# Patient Record
Sex: Male | Born: 1975 | Race: White | Hispanic: No | Marital: Single | Smoking: Never smoker
Health system: Southern US, Community
[De-identification: ages and names within clinical notes are randomized; demographics above are authoritative.]

## PROBLEM LIST (undated history)

## (undated) DIAGNOSIS — T8859XA Other complications of anesthesia, initial encounter: Secondary | ICD-10-CM

## (undated) DIAGNOSIS — M47816 Spondylosis without myelopathy or radiculopathy, lumbar region: Secondary | ICD-10-CM

## (undated) DIAGNOSIS — K209 Esophagitis, unspecified without bleeding: Secondary | ICD-10-CM

## (undated) DIAGNOSIS — K219 Gastro-esophageal reflux disease without esophagitis: Secondary | ICD-10-CM

## (undated) DIAGNOSIS — K2100 Gastro-esophageal reflux disease with esophagitis, without bleeding: Secondary | ICD-10-CM

## (undated) HISTORY — PX: TONSILLECTOMY: SUR1361

## (undated) HISTORY — DX: Gastro-esophageal reflux disease without esophagitis: K21.9

## (undated) HISTORY — PX: ADENOIDECTOMY: SUR15

---

## 2010-11-14 DIAGNOSIS — M545 Low back pain, unspecified: Secondary | ICD-10-CM | POA: Insufficient documentation

## 2013-07-13 DIAGNOSIS — F450 Somatization disorder: Secondary | ICD-10-CM | POA: Insufficient documentation

## 2014-01-25 DIAGNOSIS — M5126 Other intervertebral disc displacement, lumbar region: Secondary | ICD-10-CM | POA: Insufficient documentation

## 2014-12-23 ENCOUNTER — Emergency Department
Admission: EM | Admit: 2014-12-23 | Discharge: 2014-12-23 | Disposition: A | Discharge status: Home / Self Care | Payer: Self-pay | Attending: Emergency Medicine | Admitting: Emergency Medicine

## 2014-12-23 ENCOUNTER — Encounter: Payer: Self-pay | Admitting: Emergency Medicine

## 2014-12-23 DIAGNOSIS — T18128A Food in esophagus causing other injury, initial encounter: Secondary | ICD-10-CM | POA: Insufficient documentation

## 2014-12-23 DIAGNOSIS — X58XXXA Exposure to other specified factors, initial encounter: Secondary | ICD-10-CM | POA: Insufficient documentation

## 2014-12-23 DIAGNOSIS — Y998 Other external cause status: Secondary | ICD-10-CM | POA: Insufficient documentation

## 2014-12-23 DIAGNOSIS — T18108A Unspecified foreign body in esophagus causing other injury, initial encounter: Secondary | ICD-10-CM

## 2014-12-23 DIAGNOSIS — Y9389 Activity, other specified: Secondary | ICD-10-CM | POA: Insufficient documentation

## 2014-12-23 DIAGNOSIS — Y9289 Other specified places as the place of occurrence of the external cause: Secondary | ICD-10-CM | POA: Insufficient documentation

## 2014-12-23 HISTORY — DX: Gastro-esophageal reflux disease with esophagitis: K21.0

## 2014-12-23 HISTORY — DX: Gastro-esophageal reflux disease with esophagitis, without bleeding: K21.00

## 2014-12-23 HISTORY — DX: Spondylosis without myelopathy or radiculopathy, lumbar region: M47.816

## 2014-12-23 MED ORDER — GLUCAGON HCL RDNA (DIAGNOSTIC) 1 MG IJ SOLR
INTRAMUSCULAR | Status: AC
  Filled 2014-12-23: qty 1

## 2014-12-23 MED ORDER — GLUCAGON HCL RDNA (DIAGNOSTIC) 1 MG IJ SOLR
1.0000 mg | Freq: Once | INTRAMUSCULAR | Status: AC | PRN
  Administered 2014-12-23: 1 mg via INTRAMUSCULAR

## 2014-12-23 MED ORDER — GLUCAGON HCL RDNA (DIAGNOSTIC) 1 MG IJ SOLR: 1.0000 mg | Freq: Once | INTRAMUSCULAR | Status: DC | PRN

## 2014-12-23 MED ORDER — GLUCAGON HCL (RDNA) 1 MG IJ SOLR
1.0000 mg | Freq: Once | INTRAMUSCULAR | Status: DC
  Filled 2014-12-23: qty 1

## 2014-12-23 NOTE — ED Notes (Signed)
Attempting to swallow cold water. Pt refuses to cola reccommended by MD states too painful

## 2014-12-23 NOTE — Discharge Instructions (Signed)
Swallowed Foreign Body, Adult °You have swallowed an object (foreign body). Once the foreign body has passed through the food tube (esophagus), which leads from the mouth to the stomach, it will usually continue through the body without problems. This is because the point where the esophagus enters into the stomach is the narrowest place through which the foreign body must pass. °Sometimes the foreign body gets stuck. The most common type of foreign body obstruction in adults is food impaction. Many times, bones from fish or meat products may become lodged in the esophagus or injure the throat on the way down. When there is an object that obstructs the esophagus, the most obvious symptoms are pain and the inability to swallow normally. In some cases, foreign bodies that can be life threatening are swallowed. Examples of these are certain medications and illicit drugs. Often in these instances, patients are afraid of telling what they swallowed. However, it is extremely important to tell the emergency caregiver what was swallowed because life-saving treatment may be needed.  °X-ray exams may be taken to find the location of the foreign body. However, some objects do not show up well or may be too small to be seen on an X-ray image. If the foreign body is too large or too sharp, it may be too dangerous to allow it to pass on its own. You may need to see a caregiver who specializes in the digestive system (gastroenterologist). In a few cases, a specialist may need to remove the object using a method called "endoscopy".  This involves passing a thin, soft, flexible tube into the food pipe to locate and remove the object. Follow up with your primary doctor or the referral you were given by the emergency caregiver. °HOME CARE INSTRUCTIONS  °· If your caregiver says it is safe for you to eat, then only have liquids and soft foods until your symptoms improve. °· Once you are eating normally: °¨ Cut food into small  pieces. °¨ Remove small bones from food. °¨ Remove large seeds and pits from fruit. °¨ Chew your food well. °¨ Do not talk, laugh, or engage in physical activity while eating or swallowing. °SEEK MEDICAL CARE IF: °· You develop worsening shortness of breath, uncontrollable coughing, chest pains or high fever, greater than 102° F (38.9° C). °· You are unable to eat or drink or you feel that food is getting stuck in your throat. °· You have choking symptoms or cannot stop drooling. °· You develop abdominal pain, vomiting (especially of blood), or rectal bleeding. °MAKE SURE YOU:  °· Understand these instructions. °· Will watch your condition. °· Will get help right away if you are not doing well or get worse. °Document Released: 01/07/2010 Document Revised: 10/12/2011 Document Reviewed: 01/07/2010 °[employer]® Patient Information ©2015 [employer]. This information is not intended to replace advice given to you by your health care provider. Make sure you discuss any questions you have with your health care provider. ° °

## 2014-12-23 NOTE — ED Notes (Signed)
Pt presents to ED c/o foreign body stuck in esophagus.  He was eating apple chips an hour ago and some are stuck in his throat.  He states that in the past few minutes he began feeling short of breath as well.  He has chronic esophagitis and has had this problem in the past.  He is actively gagging and attempting to vomit during triage.

## 2014-12-23 NOTE — ED Provider Notes (Signed)
[facility] Emergency Department Provider Note  ____________________________________________  Time seen: 12:10 PM  I have reviewed the triage vital signs and the nursing notes.   HISTORY  Chief Complaint Foreign Body      HPI Vincent Huffman is a 39 y.o. male presents with a Apple food bolus stuck in throat times one hour patient admits to brief episode of dyspnea now resolved. Patient admits to chronic esophagitis and multiple episodes of food boluses being stuck in the past.     Past Medical History  Diagnosis Date  . Chronic reflux esophagitis   . Degenerative joint disease (DJD) of lumbar spine     There are no active problems to display for this patient.   Past Surgical History  Procedure Laterality Date  . Tonsillectomy    . Adenoidectomy      No current outpatient prescriptions on file.  Allergies Review of patient's allergies indicates no known allergies.  No family history on file.  Social History History  Substance Use Topics  . Smoking status: Not on file  . Smokeless tobacco: Not on file  . Alcohol Use: Not on file    Review of Systems  Constitutional: Negative for fever. Eyes: Negative for visual changes. ENT: Negative for sore throat. Positive Food stuck in throat Cardiovascular: Negative for chest pain. Respiratory: Negative for shortness of breath. Gastrointestinal: Negative for abdominal pain, vomiting and diarrhea. Genitourinary: Negative for dysuria. Musculoskeletal: Negative for back pain. Skin: Negative for rash. Neurological: Negative for headaches, focal weakness or numbness.   10-point ROS otherwise negative.  ____________________________________________   PHYSICAL EXAM:  VITAL SIGNS: ED Triage Vitals  Enc Vitals Group     BP 12/23/14 1158 131/73 mmHg     Pulse Rate 12/23/14 1158 61     Resp 12/23/14 1158 20     Temp 12/23/14 1158 98.7 F (37.1 C)     Temp Source 12/23/14 1158 Oral   SpO2 12/23/14 1158 100 %     Weight 12/23/14 1158 170 lb (77.111 kg)     Height 12/23/14 1158  (1.803 m)     Head Cir --      Peak Flow --      Pain Score 12/23/14 1200 7     Pain Loc --      Pain Edu? --      Excl. in GC? --      Constitutional: Alert and oriented. Apparent distress, patient's spitting clear sputum Eyes: Conjunctivae are normal. PERRL. Normal extraocular movements. ENT   Head: Normocephalic and atraumatic.   Nose: No congestion/rhinnorhea.   Mouth/Throat: Mucous membranes are moist.   Neck: No stridor. Hematological/Lymphatic/Immunilogical: No cervical lymphadenopathy. Cardiovascular: Normal rate, regular rhythm. Normal and symmetric distal pulses are present in all extremities. No murmurs, rubs, or gallops. Respiratory: Normal respiratory effort without tachypnea nor retractions. Breath sounds are clear and equal bilaterally. No wheezes/rales/rhonchi. Gastrointestinal: Soft and nontender. No distention. There is no CVA tenderness. Genitourinary: deferred Musculoskeletal: Nontender with normal range of motion in all extremities. No joint effusions.  No lower extremity tenderness nor edema. Neurologic:  Normal speech and language. No gross focal neurologic deficits are appreciated. Speech is normal.  Skin:  Skin is warm, dry and intact. No rash noted. Psychiatric: Mood and affect are normal. Speech and behavior are normal. Patient exhibits appropriate insight and judgment.  ____________________________________________          INITIAL IMPRESSION / ASSESSMENT AND PLAN / ED COURSE  Pertinent labs & imaging  results that were available during my care of the patient were reviewed by me and considered in my medical decision making (see chart for details).  History of physical exam consistent with esophageal food bolus as such glucagon 1 mg IM given. Patient reevaluated at 12:45 PM stated that symptoms are completely resolved. She was given water  to drink and tolerated with no difficulty. We'll discharge patient home with outpatient follow up with gastrologist.  ____________________________________________   FINAL CLINICAL IMPRESSION(S) / ED DIAGNOSES  Final diagnoses:  Esophageal foreign body, initial encounter      Darci Currentandolph N Brown, MD 12/23/14 1302

## 2015-07-08 ENCOUNTER — Emergency Department
Admission: EM | Admit: 2015-07-08 | Discharge: 2015-07-08 | Disposition: A | Discharge status: Home / Self Care | Payer: No Typology Code available for payment source | Attending: Emergency Medicine | Admitting: Emergency Medicine

## 2015-07-08 ENCOUNTER — Emergency Department: Payer: No Typology Code available for payment source

## 2015-07-08 ENCOUNTER — Encounter: Payer: Self-pay | Admitting: Emergency Medicine

## 2015-07-08 DIAGNOSIS — Y92410 Unspecified street and highway as the place of occurrence of the external cause: Secondary | ICD-10-CM | POA: Insufficient documentation

## 2015-07-08 DIAGNOSIS — S51001A Unspecified open wound of right elbow, initial encounter: Secondary | ICD-10-CM | POA: Insufficient documentation

## 2015-07-08 DIAGNOSIS — S060X1A Concussion with loss of consciousness of 30 minutes or less, initial encounter: Secondary | ICD-10-CM | POA: Diagnosis not present

## 2015-07-08 DIAGNOSIS — S01311A Laceration without foreign body of right ear, initial encounter: Secondary | ICD-10-CM | POA: Diagnosis not present

## 2015-07-08 DIAGNOSIS — R1013 Epigastric pain: Secondary | ICD-10-CM

## 2015-07-08 DIAGNOSIS — Y9389 Activity, other specified: Secondary | ICD-10-CM | POA: Diagnosis not present

## 2015-07-08 DIAGNOSIS — S0990XA Unspecified injury of head, initial encounter: Secondary | ICD-10-CM | POA: Insufficient documentation

## 2015-07-08 DIAGNOSIS — S3991XA Unspecified injury of abdomen, initial encounter: Secondary | ICD-10-CM | POA: Diagnosis not present

## 2015-07-08 DIAGNOSIS — Z23 Encounter for immunization: Secondary | ICD-10-CM | POA: Insufficient documentation

## 2015-07-08 DIAGNOSIS — Y998 Other external cause status: Secondary | ICD-10-CM | POA: Insufficient documentation

## 2015-07-08 DIAGNOSIS — S0991XA Unspecified injury of ear, initial encounter: Secondary | ICD-10-CM | POA: Diagnosis present

## 2015-07-08 HISTORY — DX: Esophagitis, unspecified without bleeding: K20.90

## 2015-07-08 HISTORY — DX: Esophagitis, unspecified: K20.9
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
## 2015-07-08 MED ORDER — OXYCODONE-ACETAMINOPHEN 5-325 MG PO TABS
1.0000 | ORAL_TABLET | Freq: Once | ORAL | Status: AC
  Administered 2015-07-08: 1 via ORAL
  Filled 2015-07-08: qty 1

## 2015-07-08 MED ORDER — BACITRACIN ZINC 500 UNIT/GM EX OINT
TOPICAL_OINTMENT | CUTANEOUS | Status: AC
  Filled 2015-07-08: qty 1.8

## 2015-07-08 MED ORDER — BACITRACIN ZINC 500 UNIT/GM EX OINT
TOPICAL_OINTMENT | Freq: Two times a day (BID) | CUTANEOUS | Status: AC
  Administered 2015-07-08: 20:00:00 via TOPICAL

## 2015-07-08 MED ORDER — IBUPROFEN 800 MG PO TABS
800.0000 mg | ORAL_TABLET | Freq: Once | ORAL | Status: DC
  Filled 2015-07-08: qty 1

## 2015-07-08 MED ORDER — OXYCODONE-ACETAMINOPHEN 5-325 MG PO TABS: 1.0000 | ORAL_TABLET | Freq: Four times a day (QID) | ORAL | Status: DC | PRN

## 2015-07-08 MED ORDER — RANITIDINE HCL 150 MG PO CAPS: 150.0000 mg | ORAL_CAPSULE | Freq: Two times a day (BID) | ORAL | Status: DC

## 2015-07-08 MED ORDER — SUCRALFATE 1 G PO TABS: 1.0000 g | ORAL_TABLET | Freq: Four times a day (QID) | ORAL | Status: DC

## 2015-07-08 MED ORDER — TETANUS-DIPHTHERIA TOXOIDS TD 5-2 LFU IM INJ
0.5000 mL | INJECTION | Freq: Once | INTRAMUSCULAR | Status: AC
  Administered 2015-07-08: 0.5 mL via INTRAMUSCULAR
  Filled 2015-07-08: qty 0.5

## 2015-07-08 MED ORDER — RABEPRAZOLE SODIUM 20 MG PO TBEC: 20.0000 mg | DELAYED_RELEASE_TABLET | Freq: Every day | ORAL | Status: DC

## 2015-07-08 NOTE — ED Notes (Signed)
AAOx3.  Skin warm and dry. Ambulates with easy and steady gait. DC home 

## 2015-07-08 NOTE — Discharge Instructions (Signed)
You were prescribed a medication that is potentially sedating. Do not drink alcohol, drive or participate in any other potentially dangerous activities while taking this medication as it may make you sleepy. Do not take this medication with any other sedating medications, either prescription or over-the-counter. If you were prescribed Percocet or Vicodin, do not take these with acetaminophen (Tylenol) as it is already contained within these medications.   Opioid pain medications (or "narcotics") can be habit forming.  Use it as little as possible to achieve adequate pain control.  Do not use or use it with extreme caution if you have a history of opiate abuse or dependence.  If you are on a pain contract with your primary care doctor or a pain specialist, be sure to let them know you were prescribed this medication today from the [facility].  This medication is intended for your use only - do not give any to anyone else and keep it in a secure place where nobody else, especially children and pets, have access to it.  It will also cause or worsen constipation, so you may want to consider taking an over-the-counter stool softener while you are taking this medication.  Abdominal Pain, Adult Many things can cause abdominal pain. Usually, abdominal pain is not caused by a disease and will improve without treatment. It can often be observed and treated at home. Your health care provider will do a physical exam and possibly order blood tests and X-rays to help determine the seriousness of your pain. However, in many cases, more time must pass before a clear cause of the pain can be found. Before that point, your health care provider may not know if you need more testing or further treatment. HOME CARE INSTRUCTIONS Monitor your abdominal pain for any changes. The following actions may help to alleviate any discomfort you are experiencing:  Only take over-the-counter or prescription  medicines as directed by your health care provider.  Do not take laxatives unless directed to do so by your health care provider.  Try a clear liquid diet (broth, tea, or water) as directed by your health care provider. Slowly move to a bland diet as tolerated. SEEK MEDICAL CARE IF:  You have unexplained abdominal pain.  You have abdominal pain associated with nausea or diarrhea.  You have pain when you urinate or have a bowel movement.  You experience abdominal pain that wakes you in the night.  You have abdominal pain that is worsened or improved by eating food.  You have abdominal pain that is worsened with eating fatty foods.  You have a fever. SEEK IMMEDIATE MEDICAL CARE IF:  Your pain does not go away within 2 hours.  You keep throwing up (vomiting).  Your pain is felt only in portions of the abdomen, such as the right side or the left lower portion of the abdomen.  You pass bloody or black tarry stools. MAKE SURE YOU:  Understand these instructions.  Will watch your condition.  Will get help right away if you are not doing well or get worse.   This information is not intended to replace advice given to you by your health care provider. Make sure you discuss any questions you have with your health care provider.   Document Released: 04/29/2005 Document Revised: 04/10/2015 Document Reviewed: 03/29/2013 Elsevier Interactive Patient Education 2016 [employer].  Concussion, Adult A concussion, or closed-head injury, is a brain injury caused by a direct blow to the head or by  a quick and sudden movement (jolt) of the head or neck. Concussions are usually not life-threatening. Even so, the effects of a concussion can be serious. If you have had a concussion before, you are more likely to experience concussion-like symptoms after a direct blow to the head.  CAUSES  Direct blow to the head, such as from running into another player during a soccer game, being hit in a  fight, or hitting your head on a hard surface.  A jolt of the head or neck that causes the brain to move back and forth inside the skull, such as in a car crash. SIGNS AND SYMPTOMS The signs of a concussion can be hard to notice. Early on, they may be missed by you, family members, and health care providers. You may look fine but act or feel differently. Symptoms are usually temporary, but they may last for days, weeks, or even longer. Some symptoms may appear right away while others may not show up for hours or days. Every head injury is different. Symptoms include:  Mild to moderate headaches that will not go away.  A feeling of pressure inside your head.  Having more trouble than usual:  Learning or remembering things you have heard.  Answering questions.  Paying attention or concentrating.  Organizing daily tasks.  Making decisions and solving problems.  Slowness in thinking, acting or reacting, speaking, or reading.  Getting lost or being easily confused.  Feeling tired all the time or lacking energy (fatigued).  Feeling drowsy.  Sleep disturbances.  Sleeping more than usual.  Sleeping less than usual.  Trouble falling asleep.  Trouble sleeping (insomnia).  Loss of balance or feeling lightheaded or dizzy.  Nausea or vomiting.  Numbness or tingling.  Increased sensitivity to:  Sounds.  Lights.  Distractions.  Vision problems or eyes that tire easily.  Diminished sense of taste or smell.  Ringing in the ears.  Mood changes such as feeling sad or anxious.  Becoming easily irritated or angry for little or no reason.  Lack of motivation.  Seeing or hearing things other people do not see or hear (hallucinations). DIAGNOSIS Your health care provider can usually diagnose a concussion based on a description of your injury and symptoms. He or she will ask whether you passed out (lost consciousness) and whether you are having trouble remembering events  that happened right before and during your injury. Your evaluation might include:  A brain scan to look for signs of injury to the brain. Even if the test shows no injury, you may still have a concussion.  Blood tests to be sure other problems are not present. TREATMENT  Concussions are usually treated in an emergency department, in urgent care, or at a clinic. You may need to stay in the hospital overnight for further treatment.  Tell your health care provider if you are taking any medicines, including prescription medicines, over-the-counter medicines, and natural remedies. Some medicines, such as blood thinners (anticoagulants) and aspirin, may increase the chance of complications. Also tell your health care provider whether you have had alcohol or are taking illegal drugs. This information may affect treatment.  Your health care provider will send you home with important instructions to follow.  How fast you will recover from a concussion depends on many factors. These factors include how severe your concussion is, what part of your brain was injured, your age, and how healthy you were before the concussion.  Most people with mild injuries recover fully. Recovery can  take time. In general, recovery is slower in older persons. Also, persons who have had a concussion in the past or have other medical problems may find that it takes longer to recover from their current injury. HOME CARE INSTRUCTIONS General Instructions  Carefully follow the directions your health care provider gave you.  Only take over-the-counter or prescription medicines for pain, discomfort, or fever as directed by your health care provider.  Take only those medicines that your health care provider has approved.  Do not drink alcohol until your health care provider says you are well enough to do so. Alcohol and certain other drugs may slow your recovery and can put you at risk of further injury.  If it is harder than  usual to remember things, write them down.  If you are easily distracted, try to do one thing at a time. For example, do not try to watch TV while fixing dinner.  Talk with family members or close friends when making important decisions.  Keep all follow-up appointments. Repeated evaluation of your symptoms is recommended for your recovery.  Watch your symptoms and tell others to do the same. Complications sometimes occur after a concussion. Older adults with a brain injury may have a higher risk of serious complications, such as a blood clot on the brain.  Tell your teachers, school nurse, school counselor, coach, athletic trainer, or work Production designer, theatre/television/film about your injury, symptoms, and restrictions. Tell them about what you can or cannot do. They should watch for:  Increased problems with attention or concentration.  Increased difficulty remembering or learning new information.  Increased time needed to complete tasks or assignments.  Increased irritability or decreased ability to cope with stress.  Increased symptoms.  Rest. Rest helps the brain to heal. Make sure you:  Get plenty of sleep at night. Avoid staying up late at night.  Keep the same bedtime hours on weekends and weekdays.  Rest during the day. Take daytime naps or rest breaks when you feel tired.  Limit activities that require a lot of thought or concentration. These include:  Doing homework or job-related work.  Watching TV.  Working on the computer.  Avoid any situation where there is potential for another head injury (football, hockey, soccer, basketball, martial arts, downhill snow sports and horseback riding). Your condition will get worse every time you experience a concussion. You should avoid these activities until you are evaluated by the appropriate follow-up health care providers. Returning To Your Regular Activities You will need to return to your normal activities slowly, not all at once. You must give  your body and brain enough time for recovery.  Do not return to sports or other athletic activities until your health care provider tells you it is safe to do so.  Ask your health care provider when you can drive, ride a bicycle, or operate heavy machinery. Your ability to react may be slower after a brain injury. Never do these activities if you are dizzy.  Ask your health care provider about when you can return to work or school. Preventing Another Concussion It is very important to avoid another brain injury, especially before you have recovered. In rare cases, another injury can lead to permanent brain damage, brain swelling, or death. The risk of this is greatest during the first 7-10 days after a head injury. Avoid injuries by:  Wearing a seat belt when riding in a car.  Drinking alcohol only in moderation.  Wearing a helmet when biking, skiing,  skateboarding, skating, or doing similar activities.  Avoiding activities that could lead to a second concussion, such as contact or recreational sports, until your health care provider says it is okay.  Taking safety measures in your home.  Remove clutter and tripping hazards from floors and stairways.  Use grab bars in bathrooms and handrails by stairs.  Place non-slip mats on floors and in bathtubs.  Improve lighting in dim areas. SEEK MEDICAL CARE IF:  You have increased problems paying attention or concentrating.  You have increased difficulty remembering or learning new information.  You need more time to complete tasks or assignments than before.  You have increased irritability or decreased ability to cope with stress.  You have more symptoms than before. Seek medical care if you have any of the following symptoms for more than 2 weeks after your injury:  Lasting (chronic) headaches.  Dizziness or balance problems.  Nausea.  Vision problems.  Increased sensitivity to noise or light.  Depression or mood  swings.  Anxiety or irritability.  Memory problems.  Difficulty concentrating or paying attention.  Sleep problems.  Feeling tired all the time. SEEK IMMEDIATE MEDICAL CARE IF:  You have severe or worsening headaches. These may be a sign of a blood clot in the brain.  You have weakness (even if only in one hand, leg, or part of the face).  You have numbness.  You have decreased coordination.  You vomit repeatedly.  You have increased sleepiness.  One pupil is larger than the other.  You have convulsions.  You have slurred speech.  You have increased confusion. This may be a sign of a blood clot in the brain.  You have increased restlessness, agitation, or irritability.  You are unable to recognize people or places.  You have neck pain.  It is difficult to wake you up.  You have unusual behavior changes.  You lose consciousness. MAKE SURE YOU:  Understand these instructions.  Will watch your condition.  Will get help right away if you are not doing well or get worse.   This information is not intended to replace advice given to you by your health care provider. Make sure you discuss any questions you have with your health care provider.   Document Released: 10/10/2003 Document Revised: 08/10/2014 Document Reviewed: 02/09/2013 Elsevier Interactive Patient Education 2016 [employer].  Stitches, [place], or Adhesive Wound Closure Health care providers use stitches (sutures), staples, and certain glue (skin adhesives) to hold skin together while it heals (wound closure). You may need this treatment after you have surgery or if you cut your skin accidentally. These methods help your skin to heal more quickly and make it less likely that you will have a scar. A wound may take several months to heal completely. The type of wound you have determines when your wound gets closed. In most cases, the wound is closed as soon as possible (primary skin closure).  Sometimes, closure is delayed so the wound can be cleaned and allowed to heal naturally. This reduces the chance of infection. Delayed closure may be needed if your wound:  Is caused by a bite.  Happened more than 6 hours ago.  Involves loss of skin or the tissues under the skin.  Has dirt or debris in it that cannot be removed.  Is infected. WHAT ARE THE DIFFERENT KINDS OF WOUND CLOSURES? There are many options for wound closure. The one that your health care provider uses depends on how deep and how large  your wound is. Adhesive Glue To use this type of glue to close a wound, your health care provider holds the edges of the wound together and paints the glue on the surface of your skin. You may need more than one layer of glue. Then the wound may be covered with a light bandage (dressing). This type of skin closure may be used for small wounds that are not deep (superficial). Using glue for wound closure is less painful than other methods. It does not require a medicine that numbs the area (local anesthetic). This method also leaves nothing to be removed. Adhesive glue is often used for children and on facial wounds. Adhesive glue cannot be used for wounds that are deep, uneven, or bleeding. It is not used inside of a wound.  Adhesive Strips These strips are made of sticky (adhesive), porous paper. They are applied across your skin edges like a regular adhesive bandage. You leave them on until they fall off. Adhesive strips may be used to close very superficial wounds. They may also be used along with sutures to improve the closure of your skin edges.  Sutures Sutures are the oldest method of wound closure. Sutures can be made from natural substances, such as silk, or from synthetic materials, such as nylon and steel. They can be made from a material that your body can break down as your wound heals (absorbable), or they can be made from a material that needs to be removed from your skin  (nonabsorbable). They come in many different strengths and sizes. Your health care provider attaches the sutures to a steel needle on one end. Sutures can be passed through your skin, or through the tissues beneath your skin. Then they are tied and cut. Your skin edges may be closed in one continuous stitch or in separate stitches. Sutures are strong and can be used for all kinds of wounds. Absorbable sutures may be used to close tissues under the skin. The disadvantage of sutures is that they may cause skin reactions that lead to infection. Nonabsorbable sutures need to be removed. Staples When surgical staples are used to close a wound, the edges of your skin on both sides of the wound are brought close together. A staple is placed across the wound, and an instrument secures the edges together. Staples are often used to close surgical cuts (incisions). Staples are faster to use than sutures, and they cause less skin reaction. Staples need to be removed using a tool that bends the staples away from your skin. HOW DO I CARE FOR MY WOUND CLOSURE?  Take medicines only as directed by your health care provider.  If you were prescribed an antibiotic medicine for your wound, finish it all even if you start to feel better.  Use ointments or creams only as directed by your health care provider.  Wash your hands with soap and water before and after touching your wound.  Do not soak your wound in water. Do not take baths, swim, or use a hot tub until your health care provider approves.  Ask your health care provider when you can start showering. Cover your wound if directed by your health care provider.  Do not take out your own sutures or staples.  Do not pick at your wound. Picking can cause an infection.  Keep all follow-up visits as directed by your health care provider. This is important. HOW LONG WILL I HAVE MY WOUND CLOSURE?  Leave adhesive glue on your skin until  the glue peels  away.  Leave adhesive strips on your skin until the strips fall off.  Absorbable sutures will dissolve within several days.  Nonabsorbable sutures and staples must be removed. The location of the wound will determine how long they stay in. This can range from several days to a couple of weeks. WHEN SHOULD I SEEK HELP FOR MY WOUND CLOSURE? Contact your health care provider if:  You have a fever.  You have chills.  You have drainage, redness, swelling, or pain at your wound.  There is a bad smell coming from your wound.  The skin edges of your wound start to separate after your sutures have been removed.  Your wound becomes thick, raised, and darker in color after your sutures come out (scarring).   This information is not intended to replace advice given to you by your health care provider. Make sure you discuss any questions you have with your health care provider.   Document Released: 04/14/2001 Document Revised: 08/10/2014 Document Reviewed: 12/27/2013 Elsevier Interactive Patient Education [employer]
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
## 2017-03-15 ENCOUNTER — Other Ambulatory Visit: Payer: Self-pay

## 2017-03-15 ENCOUNTER — Encounter: Payer: Self-pay | Admitting: Emergency Medicine

## 2017-03-15 ENCOUNTER — Emergency Department
Admission: EM | Admit: 2017-03-15 | Discharge: 2017-03-15 | Disposition: A | Discharge status: Home / Self Care | Payer: Self-pay | Attending: Emergency Medicine | Admitting: Emergency Medicine

## 2017-03-15 DIAGNOSIS — Z79899 Other long term (current) drug therapy: Secondary | ICD-10-CM | POA: Insufficient documentation

## 2017-03-15 DIAGNOSIS — F419 Anxiety disorder, unspecified: Secondary | ICD-10-CM

## 2017-03-15 DIAGNOSIS — T50901A Poisoning by unspecified drugs, medicaments and biological substances, accidental (unintentional), initial encounter: Secondary | ICD-10-CM | POA: Insufficient documentation

## 2017-03-15 MED ORDER — HYDROXYZINE HCL 25 MG PO TABS
50.0000 mg | ORAL_TABLET | Freq: Once | ORAL | Status: AC
  Administered 2017-03-15: 50 mg via ORAL
  Filled 2017-03-15: qty 2

## 2017-03-15 NOTE — ED Triage Notes (Signed)
Pt arrives POV with c/o accicental overdose. Pt has multiple herbs and compounds that he uses and tonight he took 700 mg of his L-Tatrahydropalmatine instead of 50 mg around 1700. Pt is jittery in triage and poison control has been notified. Pt reports that he took this around 1700 in combination with 10-15 other herbal substances. Pt is in NAD at this time.

## 2017-03-15 NOTE — Discharge Instructions (Signed)
Return to the ER for worsening symptoms, peristent vomiting, diffulty breathing or other concern

## 2017-03-15 NOTE — ED Notes (Signed)
Pt sleeping with no distress noted at this time.

## 2017-03-15 NOTE — ED Provider Notes (Signed)
[facility] Emergency Department Provider Note   ____________________________________________   First MD Initiated Contact with Patient 03/15/17 0118     (approximate)  I have reviewed the triage vital signs and the nursing notes.   HISTORY  Chief Complaint Ingestion    HPI Vincent Huffman is a 41 y.o. male who presents to the ED from home with a chief complaint of accidental overdose. Patient takes multiple over-the-counter herbal medications who states he accidentally took 700 mg of L-Tatrahydropalmatine instead of 50 mg at approximately 5 PM. Feels jittery and anxious. States he takes herbal medications for chronic back pain and inflammation.reports stressful week this week. Denies fever, chills, chest pain, shortness of breath, abdominal pain, nausea, vomiting, diarrhea. Denies SI/AH/VH. Denies recent travel or trauma.  Past Medical History:  Diagnosis Date  . Chronic reflux esophagitis   . Degenerative joint disease (DJD) of lumbar spine   . Esophagitis   Anxiety/panic attacks  There are no active problems to display for this patient.   Past Surgical History:  Procedure Laterality Date  . ADENOIDECTOMY    . TONSILLECTOMY      Prior to Admission medications   Medication Sig Start Date End Date Taking? Authorizing Provider  oxyCODONE-acetaminophen (ROXICET) 5-325 MG tablet Take 1 tablet by mouth every 6 (six) hours as needed for severe pain. 07/08/15   Sharman CheekStafford, Phillip, MD  RABEprazole (ACIPHEX) 20 MG tablet Take 1 tablet (20 mg total) by mouth daily. 07/08/15   Sharman CheekStafford, Phillip, MD  sucralfate (CARAFATE) 1 G tablet Take 1 tablet (1 g total) by mouth 4 (four) times daily. 07/08/15   Sharman CheekStafford, Phillip, MD    Allergies Patient has no known allergies.  No family history on file.  Social History Social History  Substance Use Topics  . Smoking status: Never Smoker  . Smokeless tobacco: Never Used  . Alcohol use No    Review of  Systems  Constitutional: No fever/chills. Eyes: No visual changes. ENT: No sore throat. Cardiovascular: Denies chest pain. Respiratory: Denies shortness of breath. Gastrointestinal: No abdominal pain.  No nausea, no vomiting.  No diarrhea.  No constipation. Genitourinary: Negative for dysuria. Musculoskeletal: Negative for back pain. Skin: Negative for rash. Neurological: Negative for headaches, focal weakness or numbness. Psychiatric:Positive for anxiety and jitteriness.  ____________________________________________   PHYSICAL EXAM:  VITAL SIGNS: ED Triage Vitals  Enc Vitals Group     BP 03/15/17 0029 109/72     Pulse Rate 03/15/17 0029 65     Resp 03/15/17 0029 18     Temp 03/15/17 0029 98.2 F (36.8 C)     Temp Source 03/15/17 0029 Oral     SpO2 03/15/17 0029 98 %     Weight 03/15/17 0030 185 lb (83.9 kg)     Height 03/15/17 0030 6' (1.829 m)     Head Circumference --      Peak Flow --      Pain Score 03/15/17 0029 0     Pain Loc --      Pain Edu? --      Excl. in GC? --     Constitutional: Alert and oriented. Well appearing and in no acute distress. Eyes: Conjunctivae are normal. PERRL. EOMI. Head: Atraumatic. Nose: No congestion/rhinnorhea. Mouth/Throat: Mucous membranes are moist.  Oropharynx non-erythematous. Neck: No stridor.   Cardiovascular: Normal rate, regular rhythm. Grossly normal heart sounds.  Good peripheral circulation. Respiratory: Normal respiratory effort.  No retractions. Lungs CTAB. Gastrointestinal: Soft and nontender. No distention. No  abdominal bruits. No CVA tenderness. Musculoskeletal: No lower extremity tenderness nor edema.  No joint effusions. Neurologic:  Normal speech and language. No gross focal neurologic deficits are appreciated. No gait instability. Skin:  Skin is warm, dry and intact. No rash noted. Psychiatric: Mood and affect are anxious. Speech and behavior are normal.  ____________________________________________    LABS (all labs ordered are listed, but only abnormal results are displayed)  Labs Reviewed - No data to display ____________________________________________  EKG  ED ECG REPORT I, SUNG,JADE J, the attending physician, personally viewed and interpreted this ECG.   Date: 03/15/2017  EKG Time: 0100  Rate: 53  Rhythm: sinus bradycardia  Axis: normal  Intervals:none  ST&T Change: nonspecific  ____________________________________________  RADIOLOGY  No results found.  ____________________________________________   PROCEDURES  Procedure(s) performed: None  Procedures  Critical Care performed: No  ____________________________________________   INITIAL IMPRESSION / ASSESSMENT AND PLAN / ED COURSE  Pertinent labs & imaging results that were available during my care of the patient were reviewed by me and considered in my medical decision making (see chart for details).  41 year old male who presents with accidental overdose of herbal medication Presents with anxiety and feeling jittery. Triage nurse discussed with poison control; L-Tatrahydropalmatine is actually an anxiolytic. The quantity patient ingested is nontoxic and half life is only 2 hours so it should be well out of his system by now. However, patient took this in combination with multiple herbs and compounds which may be stimulants.. EKG within normal limits. Patient feeling anxious but declines benzodiazepines as he has been on them before and had a hard time getting off. Will administer hydroxyzine Patient reluctant to go home at this time secondary to anxiety; will observe overnight and patient may be discharged in the morning.  Clinical Course as of Mar 16 543  Mon Mar 15, 2017  0543 Patient slept comfortably. Now awake, feels much better and eager for discharge. Strict return precautions given. Patient verbalizes understanding and agrees with plan of care.  [JS]    Clinical Course User Index [JS] Irean Hong, MD     ____________________________________________   FINAL CLINICAL IMPRESSION(S) / ED DIAGNOSES  Final diagnoses:  Accidental drug overdose, initial encounter  Anxiety      NEW MEDICATIONS STARTED DURING THIS VISIT:  New Prescriptions   No medications on file     Note:  This document was prepared using Dragon voice recognition software and may include unintentional dictation errors.    Irean Hong, MD 03/15/17 [phone]

## 2017-03-15 NOTE — ED Notes (Signed)
See triage note of pt ingestion. Pt denies any SI/HI, pt c/o mild panic attacks with lethargy since ingestion. Pt A&O, pt appears to be in no distress at this time, respirations even and unlabored. Per triage nurse Lurena Joinerebecca, RN has talked to poison control and informed to monitor pt only.

## 2017-03-15 NOTE — ED Notes (Signed)
Per poison control Patty, no labs are necessary for treatment. Compound was not ingested at a level (1500-1750 mg) Peak absorption is around 2 hours after ingestion per poison control. No observation needed or medical testing needed per poison control.

## 2017-12-09 ENCOUNTER — Ambulatory Visit: Payer: Self-pay

## 2017-12-16 ENCOUNTER — Ambulatory Visit: Payer: Self-pay | Admitting: Adult Health Nurse Practitioner

## 2017-12-16 VITALS — BP 123/69 | HR 76 | Temp 98.3°F | Wt 177.5 lb

## 2017-12-16 DIAGNOSIS — Z0000 Encounter for general adult medical examination without abnormal findings: Secondary | ICD-10-CM | POA: Insufficient documentation

## 2017-12-16 NOTE — Progress Notes (Signed)
   Subjective:    Patient ID: Vincent Huffman, male    DOB: 1975-12-31, 42 y.o.   MRN: [id number]  HPI  Vincent Huffman is a 42 yo M here to re-establish care with [place] . He presents with complaints of back pain and stiffness. Back pain: pt has seen specialist in [place] and has been referred to pain management. He takes natural remedies and Doans and manages it himself.  He does not take med for anything else.  He does not smoke or drink or use drugs.   Patient Active Problem List   Diagnosis Date Noted  . Healthcare maintenance 12/16/2017   Allergies as of 12/16/2017   No Known Allergies     Medication List        Accurate as of 12/16/17  7:44 PM. Always use your most recent med list.          oxyCODONE-acetaminophen 5-325 MG tablet Commonly known as:  ROXICET Take 1 tablet by mouth every 6 (six) hours as needed for severe pain.   RABEprazole 20 MG tablet Commonly known as:  ACIPHEX Take 1 tablet (20 mg total) by mouth daily.   sucralfate 1 g tablet Commonly known as:  CARAFATE Take 1 tablet (1 g total) by mouth 4 (four) times daily.        Review of Systems     Objective:   Physical Exam  Constitutional: He is oriented to person, place, and time. He appears well-developed and well-nourished.  Cardiovascular: Normal rate, regular rhythm and normal heart sounds.  Pulmonary/Chest: Effort normal and breath sounds normal.  Abdominal: Soft. Bowel sounds are normal.  Neurological: He is alert and oriented to person, place, and time.  Vitals reviewed.   BP 123/69   Pulse 76   Temp 98.3 F (36.8 C)   Wt 177 lb 8 oz (80.5 kg)   BMI 24.07 kg/m        Assessment & Plan:    Labs tonight.  Completed forms for substitute teaching.  Vision 20/15 bilateral.   F/u in 4 weeks for lab review.

## 2017-12-17 LAB — COMPREHENSIVE METABOLIC PANEL
A/G RATIO: 2.6 — AB (ref 1.2–2.2)
ALT: 17 IU/L (ref 0–44)
AST: 23 IU/L (ref 0–40)
Albumin: 4.7 g/dL (ref 3.5–5.5)
Alkaline Phosphatase: 47 IU/L (ref 39–117)
BUN/Creatinine Ratio: 12 (ref 9–20)
BUN: 12 mg/dL (ref 6–24)
Bilirubin Total: 0.6 mg/dL (ref 0.0–1.2)
CALCIUM: 9.3 mg/dL (ref 8.7–10.2)
CHLORIDE: 98 mmol/L (ref 96–106)
CO2: 25 mmol/L (ref 20–29)
Creatinine, Ser: 1.01 mg/dL (ref 0.76–1.27)
GFR calc Af Amer: 106 mL/min/{1.73_m2} (ref >59)
GFR calc non Af Amer: 92 mL/min/{1.73_m2} (ref >59)
GLUCOSE: 84 mg/dL (ref 65–99)
Globulin, Total: 1.8 g/dL (ref 1.5–4.5)
POTASSIUM: 4.5 mmol/L (ref 3.5–5.2)
Sodium: 140 mmol/L (ref 134–144)
Total Protein: 6.5 g/dL (ref 6.0–8.5)

## 2017-12-17 LAB — CBC
Hematocrit: 39.4 % (ref 37.5–51.0)
Hemoglobin: 13.1 g/dL (ref 13.0–17.7)
MCH: 30.3 pg (ref 26.6–33.0)
MCHC: 33.2 g/dL (ref 31.5–35.7)
MCV: 91 fL (ref 79–97)
PLATELETS: 228 10*3/uL (ref 150–379)
RBC: 4.32 x10E6/uL (ref 4.14–5.80)
RDW: 12.9 % (ref 12.3–15.4)
WBC: 7.1 10*3/uL (ref 3.4–10.8)

## 2017-12-17 LAB — LIPID PANEL
CHOL/HDL RATIO: 4.3 ratio (ref 0.0–5.0)
Cholesterol, Total: 155 mg/dL (ref 100–199)
HDL: 36 mg/dL — ABNORMAL LOW (ref >39)
LDL Calculated: 97 mg/dL (ref 0–99)
Triglycerides: 112 mg/dL (ref 0–149)
VLDL Cholesterol Cal: 22 mg/dL (ref 5–40)

## 2017-12-17 LAB — TSH: TSH: 1.91 u[IU]/mL (ref 0.450–4.500)

## 2017-12-17 LAB — HEMOGLOBIN A1C
Est. average glucose Bld gHb Est-mCnc: 91 mg/dL
Hgb A1c MFr Bld: 4.8 % (ref 4.8–5.6)

## 2018-01-11 ENCOUNTER — Ambulatory Visit: Payer: Self-pay | Admitting: Family Medicine

## 2018-01-11 VITALS — BP 118/71 | HR 86 | Temp 98.3°F | Ht 72.0 in | Wt 167.7 lb

## 2018-01-11 DIAGNOSIS — Z8739 Personal history of other diseases of the musculoskeletal system and connective tissue: Secondary | ICD-10-CM

## 2018-01-11 DIAGNOSIS — Z09 Encounter for follow-up examination after completed treatment for conditions other than malignant neoplasm: Secondary | ICD-10-CM

## 2018-01-11 NOTE — Progress Notes (Signed)
Subjective:    Patient ID: Vincent Huffman, male    DOB: 1975-12-01, 42 y.o.   MRN: [id number]   PCP: Raliegh Ip, NP  Chief Complaint  Patient presents with  . Follow-up    Follow-up for blood exams   HPI  Mr. Schappert has a history of DJD and Esophagitis. He is here for follow up.   Current Status: Since his last office visit, he is doing well with no complaints. He denies fevers, chills, fatigue, recent infections, weight loss, and night sweats. He has not had any headaches, visual changes, dizziness, and falls. No chest pain, heart palpitations, cough and shortness of breath reported. No reports of GI problems. He has no reports of blood in stools, dysuria and hematuria. No depression or anxiety.   He has DJD and reports mild pain today, which he takes Acetaminophen for relief.   Past Medical History:  Diagnosis Date  . Chronic reflux esophagitis   . Degenerative joint disease (DJD) of lumbar spine   . Esophagitis     No family history on file.  Social History   Socioeconomic History  . Marital status: Single    Spouse name: Not on file  . Number of children: Not on file  . Years of education: Not on file  . Highest education level: Not on file  Occupational History  . Occupation: Working Chief Executive Officer  . Financial resource strain: Hard  . Food insecurity:    Worry: Sometimes true    Inability: Sometimes true  . Transportation needs:    Medical: No    Non-medical: No  Tobacco Use  . Smoking status: Never Smoker  . Smokeless tobacco: Never Used  Substance and Sexual Activity  . Alcohol use: No  . Drug use: No  . Sexual activity: Not on file  Lifestyle  . Physical activity:    Days per week: 5 days    Minutes per session: 60 min  . Stress: Only a little  Relationships  . Social connections:    Talks on phone: Once a week    Gets together: Once a week    Attends religious service: More than 4 times per year    Active member of club or  organization: No    Attends meetings of clubs or organizations: Never    Relationship status: Never married  . Intimate partner violence:    Fear of current or ex partner: Patient refused    Emotionally abused: Patient refused    Physically abused: Patient refused    Forced sexual activity: Patient refused  Other Topics Concern  . Not on file  Social History Narrative  . Not on file    Past Surgical History:  Procedure Laterality Date  . ADENOIDECTOMY    . TONSILLECTOMY      Immunization History  Administered Date(s) Administered  . Td 07/08/2015    No outpatient medications have been marked as taking for the 01/11/18 encounter (Office Visit) with Kallie Locks, FNP.    No Known Allergies   BP 118/71   Pulse 86   Temp 98.3 F (36.8 C)   Ht 6' (1.829 m)   Wt 167 lb 11.2 oz (76.1 kg)   BMI 22.74 kg/m     Review of Systems  Constitutional: Negative.   HENT: Negative.   Eyes: Negative.   Respiratory: Negative.   Cardiovascular: Negative.   Gastrointestinal: Negative.   Endocrine: Negative.   Genitourinary: Negative.   Musculoskeletal: Positive for  back pain (Degenerative Disc Disease).  Skin: Negative.   Allergic/Immunologic: Negative.   Neurological: Negative.   Hematological: Negative.   Psychiatric/Behavioral: Negative.    Objective:   Physical Exam  Constitutional: He is oriented to person, place, and time. He appears well-developed and well-nourished.  HENT:  Head: Normocephalic and atraumatic.  Right Ear: External ear normal.  Left Ear: External ear normal.  Nose: Nose normal.  Mouth/Throat: Oropharynx is clear and moist.  Eyes: Pupils are equal, round, and reactive to light. Conjunctivae and EOM are normal.  Neck: Normal range of motion. Neck supple.  Cardiovascular: Normal rate, regular rhythm, normal heart sounds and intact distal pulses.  Pulmonary/Chest: Effort normal and breath sounds normal.  Abdominal: Soft. Bowel sounds are normal.   Musculoskeletal:  Limited ROM in back  Neurological: He is alert and oriented to person, place, and time.  Skin: Skin is warm and dry. Capillary refill takes less than 2 seconds.  Psychiatric: He has a normal mood and affect. His behavior is normal. Judgment and thought content normal.  Nursing note and vitals reviewed.  Assessment & Plan:   1. Hx of degenerative disc disease He has been referred to Pain Management for DJD. He pain is stable today. He will continue to use Acetaminophen for pain.   2. Follow up We will follow up with him in 6 months.   No orders of the defined types were placed in this encounter.   Raliegh IpNatalie [facility] [place],  MSN, FNP-BC [facility] [place] [address] [place], [place] [address] [phone]

## 2018-07-05 ENCOUNTER — Ambulatory Visit: Payer: Self-pay

## 2018-07-06 ENCOUNTER — Ambulatory Visit: Payer: Self-pay | Admitting: Internal Medicine

## 2018-07-06 ENCOUNTER — Encounter: Payer: Self-pay | Admitting: Internal Medicine

## 2018-07-06 VITALS — BP 111/70 | HR 54 | Temp 97.6°F | Ht 72.0 in | Wt 165.7 lb

## 2018-07-06 DIAGNOSIS — Z0000 Encounter for general adult medical examination without abnormal findings: Secondary | ICD-10-CM

## 2018-07-06 NOTE — Progress Notes (Signed)
  Subjective:    Patient ID: Vincent Huffman, male    DOB: 1976/01/23, 42 y.o.   MRN: [id number]  HPI  Pt is a 42 year old male who presents for a follow up at [facility]. Pt reports a hx of degenerative disc disease in the lower back. Pt does report any headaches or dizzy spells. He does not report any unmanaged pain. Pt reports hx of occasional chronic esophagitis. Pt reports waking up during the night to urinate but also claims he drinks a lot of water.   Chief Complaint  Patient presents with  . Follow-up    would like to know reports of most recent labs; reports stenosis in back and discs L4, L5, and S1     Review of Systems Patient Active Problem List   Diagnosis Date Noted  . Healthcare maintenance 12/16/2017   Allergies as of 07/06/2018   No Known Allergies     Medication List        Accurate as of 07/06/18 10:18 AM. Always use your most recent med list.          oxyCODONE-acetaminophen 5-325 MG tablet Commonly known as:  PERCOCET/ROXICET Take 1 tablet by mouth every 6 (six) hours as needed for severe pain.   RABEprazole 20 MG tablet Commonly known as:  ACIPHEX Take 1 tablet (20 mg total) by mouth daily.   sucralfate 1 g tablet Commonly known as:  CARAFATE Take 1 tablet (1 g total) by mouth 4 (four) times daily.          Objective:   Physical Exam  Constitutional: He is oriented to person, place, and time.  Cardiovascular: Normal rate, regular rhythm and normal heart sounds.  Pulmonary/Chest: Effort normal and breath sounds normal.  Neurological: He is alert and oriented to person, place, and time.   BP 111/70   Pulse (!) 54   Temp 97.6 F (36.4 C) (Oral)   Ht 6' (1.829 m)   Wt 165 lb 11.2 oz (75.2 kg)   BMI 22.47 kg/m      Assessment & Plan:   1. Healthcare maintenance Pt evaluation appears to be stable no further evaluation needed at this time.   Recommended flu vaccination and pt denied.   Follow up as needed.

## 2018-12-28 ENCOUNTER — Encounter: Payer: Self-pay | Admitting: Adult Health

## 2018-12-28 ENCOUNTER — Other Ambulatory Visit: Payer: Self-pay | Admitting: Adult Health

## 2018-12-28 ENCOUNTER — Other Ambulatory Visit: Payer: Self-pay

## 2018-12-28 ENCOUNTER — Ambulatory Visit: Payer: BC Managed Care – PPO | Admitting: Adult Health

## 2018-12-28 VITALS — BP 116/72 | HR 55 | Resp 16 | Ht 72.0 in | Wt 174.0 lb

## 2018-12-28 DIAGNOSIS — Z0000 Encounter for general adult medical examination without abnormal findings: Secondary | ICD-10-CM | POA: Diagnosis not present

## 2018-12-28 DIAGNOSIS — M791 Myalgia, unspecified site: Secondary | ICD-10-CM

## 2018-12-28 NOTE — Progress Notes (Signed)
[facility] [address] [place], [place] [address]  Internal MEDICINE  Office Visit Note  Patient Name: Vincent Huffman  [id number]  [id number]  Date of Service: 12/28/2018   Complaints/HPI Pt is here for establishment of PCP. Chief Complaint  Patient presents with  . Medical Management of Chronic Issues    new pt establish care    HPI Patient is here to establish care with our practice.  He is a well-appearing 43 year old Caucasian male.  He denies any significant medical history at this time.  He does not currently take any medications.  He reports taking some natural supplements however he does not take them regularly.  Current Medication: Outpatient Encounter Medications as of 12/28/2018  Medication Sig  . [DISCONTINUED] oxyCODONE-acetaminophen (ROXICET) 5-325 MG tablet Take 1 tablet by mouth every 6 (six) hours as needed for severe pain. (Patient not taking: Reported on 12/16/2017)  . [DISCONTINUED] RABEprazole (ACIPHEX) 20 MG tablet Take 1 tablet (20 mg total) by mouth daily. (Patient not taking: Reported on 12/16/2017)  . [DISCONTINUED] ranitidine (ZANTAC) 150 MG capsule Take 1 capsule (150 mg total) by mouth 2 (two) times daily.  . [DISCONTINUED] sucralfate (CARAFATE) 1 G tablet Take 1 tablet (1 g total) by mouth 4 (four) times daily. (Patient not taking: Reported on 12/16/2017)   No facility-administered encounter medications on file as of 12/28/2018.     Surgical History: Past Surgical History:  Procedure Laterality Date  . ADENOIDECTOMY    . TONSILLECTOMY      Medical History: Past Medical History:  Diagnosis Date  . Chronic reflux esophagitis   . Degenerative joint disease (DJD) of lumbar spine   . Esophagitis   . GERD (gastroesophageal reflux disease)     Family History: Family History  Problem Relation Age of Onset  . Heart disease Mother   . Heart disease Father   . Diabetes Maternal Grandfather     Social History   Socioeconomic History   . Marital status: Single    Spouse name: Not on file  . Number of children: Not on file  . Years of education: Not on file  . Highest education level: Not on file  Occupational History  . Occupation: Working Chief Executive Officer  . Financial resource strain: Hard  . Food insecurity:    Worry: Sometimes true    Inability: Sometimes true  . Transportation needs:    Medical: No    Non-medical: No  Tobacco Use  . Smoking status: Never Smoker  . Smokeless tobacco: Never Used  Substance and Sexual Activity  . Alcohol use: No  . Drug use: No  . Sexual activity: Not on file  Lifestyle  . Physical activity:    Days per week: 5 days    Minutes per session: 60 min  . Stress: Only a little  Relationships  . Social connections:    Talks on phone: Once a week    Gets together: Once a week    Attends religious service: More than 4 times per year    Active member of club or organization: No    Attends meetings of clubs or organizations: Never    Relationship status: Never married  . Intimate partner violence:    Fear of current or ex partner: Patient refused    Emotionally abused: Patient refused    Physically abused: Patient refused    Forced sexual activity: Patient refused  Other Topics Concern  . Not on file  Social History Narrative  .  Not on file     Review of Systems  Constitutional: Negative.  Negative for chills, fatigue and unexpected weight change.  HENT: Negative.  Negative for congestion, rhinorrhea, sneezing and sore throat.   Eyes: Negative for redness.  Respiratory: Negative.  Negative for cough, chest tightness and shortness of breath.   Cardiovascular: Negative.  Negative for chest pain and palpitations.  Gastrointestinal: Negative.  Negative for abdominal pain, constipation, diarrhea, nausea and vomiting.  Endocrine: Negative.   Genitourinary: Negative.  Negative for dysuria and frequency.  Musculoskeletal: Negative.  Negative for arthralgias, back  pain, joint swelling and neck pain.  Skin: Negative.  Negative for rash.  Allergic/Immunologic: Negative.   Neurological: Negative.  Negative for tremors and numbness.  Hematological: Negative for adenopathy. Does not bruise/bleed easily.  Psychiatric/Behavioral: Negative.  Negative for behavioral problems, sleep disturbance and suicidal ideas. The patient is not nervous/anxious.     Vital Signs: BP 116/72 (BP Location: Left Arm, Patient Position: Sitting, Cuff Size: Normal)   Pulse (!) 55   Resp 16   Ht 6' (1.829 m)   Wt 174 lb (78.9 kg)   SpO2 98%   BMI 23.60 kg/m    Physical Exam Vitals signs and nursing note reviewed.  Constitutional:      General: He is not in acute distress.    Appearance: He is well-developed. He is not diaphoretic.  HENT:     Head: Normocephalic and atraumatic.     Mouth/Throat:     Pharynx: No oropharyngeal exudate.  Eyes:     Pupils: Pupils are equal, round, and reactive to light.  Neck:     Musculoskeletal: Normal range of motion and neck supple.     Thyroid: No thyromegaly.     Vascular: No JVD.     Trachea: No tracheal deviation.  Cardiovascular:     Rate and Rhythm: Normal rate and regular rhythm.     Heart sounds: Normal heart sounds. No murmur. No friction rub. No gallop.   Pulmonary:     Effort: Pulmonary effort is normal. No respiratory distress.     Breath sounds: Normal breath sounds. No wheezing or rales.  Chest:     Chest wall: No tenderness.  Abdominal:     Palpations: Abdomen is soft.     Tenderness: There is no abdominal tenderness. There is no guarding.  Musculoskeletal: Normal range of motion.  Lymphadenopathy:     Cervical: No cervical adenopathy.  Skin:    General: Skin is warm and dry.  Neurological:     Mental Status: He is alert and oriented to person, place, and time.     Cranial Nerves: No cranial nerve deficit.  Psychiatric:        Behavior: Behavior normal.        Thought Content: Thought content normal.         Judgment: Judgment normal.    Assessment/Plan: 1. Healthcare maintenance Patient staffed in care with our practice.  I have given patient lab slip to have laboratory values drawn since he has not had any labs done in quite a few years.  2. Muscle pain Patient reports a history of chronic muscle pain and he would like to have genetic testing done for muscle degeneration as well as bone degeneration.  General Counseling: Arend verbalizes understanding of the findings of todays visit and agrees with plan of treatment. I have discussed any further diagnostic evaluation that may be needed or ordered today. We also reviewed his medications today. he  has been encouraged to call the office with any questions or concerns that should arise related to todays visit.  No orders of the defined types were placed in this encounter.   No orders of the defined types were placed in this encounter.   Time spent: 35 Minutes   This patient was seen by Blima LedgerAdam Tammy Ericsson AGNP-C in Collaboration with Dr Lyndon CodeFozia M Khan as a part of collaborative care agreement  Johnna AcostaAdam J. Naudia Crosley AGNP-C Internal Medicine

## 2018-12-29 LAB — CBC WITH DIFFERENTIAL/PLATELET
Basophils Absolute: 0 10*3/uL (ref 0.0–0.2)
Basos: 1 %
EOS (ABSOLUTE): 0.3 10*3/uL (ref 0.0–0.4)
Eos: 5 %
Hematocrit: 40.6 % (ref 37.5–51.0)
Hemoglobin: 14.8 g/dL (ref 13.0–17.7)
Immature Grans (Abs): 0 10*3/uL (ref 0.0–0.1)
Immature Granulocytes: 0 %
Lymphocytes Absolute: 1.9 10*3/uL (ref 0.7–3.1)
Lymphs: 35 %
MCH: 32.2 pg (ref 26.6–33.0)
MCHC: 36.5 g/dL — ABNORMAL HIGH (ref 31.5–35.7)
MCV: 88 fL (ref 79–97)
Monocytes Absolute: 0.4 10*3/uL (ref 0.1–0.9)
Monocytes: 8 %
Neutrophils Absolute: 2.8 10*3/uL (ref 1.4–7.0)
Neutrophils: 51 %
Platelets: 242 10*3/uL (ref 150–450)
RBC: 4.6 x10E6/uL (ref 4.14–5.80)
RDW: 11.8 % (ref 11.6–15.4)
WBC: 5.5 10*3/uL (ref 3.4–10.8)

## 2018-12-29 LAB — COMPREHENSIVE METABOLIC PANEL
ALT: 26 IU/L (ref 0–44)
AST: 24 IU/L (ref 0–40)
Albumin/Globulin Ratio: 2.8 — ABNORMAL HIGH (ref 1.2–2.2)
Albumin: 5.1 g/dL — ABNORMAL HIGH (ref 4.0–5.0)
Alkaline Phosphatase: 55 IU/L (ref 39–117)
BUN/Creatinine Ratio: 19 (ref 9–20)
BUN: 16 mg/dL (ref 6–24)
Bilirubin Total: 0.8 mg/dL (ref 0.0–1.2)
CO2: 26 mmol/L (ref 20–29)
Calcium: 9.8 mg/dL (ref 8.7–10.2)
Chloride: 101 mmol/L (ref 96–106)
Creatinine, Ser: 0.85 mg/dL (ref 0.76–1.27)
GFR calc Af Amer: 124 mL/min/{1.73_m2} (ref >59)
GFR calc non Af Amer: 107 mL/min/{1.73_m2} (ref >59)
Globulin, Total: 1.8 g/dL (ref 1.5–4.5)
Glucose: 91 mg/dL (ref 65–99)
Potassium: 4.7 mmol/L (ref 3.5–5.2)
Sodium: 143 mmol/L (ref 134–144)
Total Protein: 6.9 g/dL (ref 6.0–8.5)

## 2018-12-29 LAB — PSA: Prostate Specific Ag, Serum: 0.5 ng/mL (ref 0.0–4.0)

## 2018-12-29 LAB — LIPID PANEL WITH LDL/HDL RATIO
Cholesterol, Total: 181 mg/dL (ref 100–199)
HDL: 63 mg/dL (ref >39)
LDL Calculated: 109 mg/dL — ABNORMAL HIGH (ref 0–99)
LDl/HDL Ratio: 1.7 ratio (ref 0.0–3.6)
Triglycerides: 43 mg/dL (ref 0–149)
VLDL Cholesterol Cal: 9 mg/dL (ref 5–40)

## 2018-12-29 LAB — TSH: TSH: 3.25 u[IU]/mL (ref 0.450–4.500)

## 2018-12-29 LAB — T4, FREE: Free T4: 1.19 ng/dL (ref 0.82–1.77)

## 2018-12-29 LAB — HGB A1C W/O EAG: Hgb A1c MFr Bld: 5 % (ref 4.8–5.6)

## 2019-01-02 ENCOUNTER — Encounter: Payer: Self-pay | Admitting: Adult Health

## 2019-01-26 ENCOUNTER — Encounter: Payer: Self-pay | Admitting: Adult Health

## 2019-01-26 ENCOUNTER — Other Ambulatory Visit: Payer: Self-pay

## 2019-01-26 ENCOUNTER — Ambulatory Visit: Payer: BC Managed Care – PPO | Admitting: Adult Health

## 2019-01-26 VITALS — BP 109/68 | HR 55 | Temp 97.1°F | Ht 72.0 in | Wt 165.0 lb

## 2019-01-26 DIAGNOSIS — Z20828 Contact with and (suspected) exposure to other viral communicable diseases: Secondary | ICD-10-CM

## 2019-01-26 DIAGNOSIS — Z0001 Encounter for general adult medical examination with abnormal findings: Secondary | ICD-10-CM | POA: Diagnosis not present

## 2019-01-26 DIAGNOSIS — M545 Low back pain: Secondary | ICD-10-CM | POA: Diagnosis not present

## 2019-01-26 DIAGNOSIS — R7989 Other specified abnormal findings of blood chemistry: Secondary | ICD-10-CM | POA: Diagnosis not present

## 2019-01-26 DIAGNOSIS — Z20822 Contact with and (suspected) exposure to covid-19: Secondary | ICD-10-CM

## 2019-01-26 DIAGNOSIS — G8929 Other chronic pain: Secondary | ICD-10-CM

## 2019-01-26 NOTE — Progress Notes (Signed)
[facility] [place], [place] [address]  Internal MEDICINE  Telephone Visit  Patient Name: Vincent Huffman  [id number]  [id number]  Date of Service: 01/26/2019  I connected with the patient at 0850 by telephone and verified the patients identity using two identifiers.   I discussed the limitations, risks, security and privacy concerns of performing an evaluation and management service by telephone and the availability of in person appointments. I also discussed with the patient that there may be a patient responsible charge related to the service.  The patient expressed understanding and agrees to proceed.    Chief Complaint  Patient presents with  . Telephone Screen  . Telephone Assessment  . Annual Exam    HPI Pt seen via video for physical appt, and follow up on physical lab work. Overall he is doing well.  Denis any current issues.  He reports he has been taking care of a family member, and was very sick a few months back.  He is interested in have a Covid-19 antibody test performed.  He also has some chronic lower back/muscle pain that he would like RX for massage therapy and chiropractic care to help him with his flex spending.      Current Medication: Outpatient Encounter Medications as of 01/26/2019  Medication Sig  . [DISCONTINUED] ranitidine (ZANTAC) 150 MG capsule Take 1 capsule (150 mg total) by mouth 2 (two) times daily.   No facility-administered encounter medications on file as of 01/26/2019.     Surgical History: Past Surgical History:  Procedure Laterality Date  . ADENOIDECTOMY    . TONSILLECTOMY      Medical History: Past Medical History:  Diagnosis Date  . Chronic reflux esophagitis   . Degenerative joint disease (DJD) of lumbar spine   . Esophagitis   . GERD (gastroesophageal reflux disease)     Family History: Family History  Problem Relation Age of Onset  . Heart disease Mother   . Heart disease Father   . Diabetes  Maternal Grandfather     Social History   Socioeconomic History  . Marital status: Single    Spouse name: Not on file  . Number of children: Not on file  . Years of education: Not on file  . Highest education level: Not on file  Occupational History  . Occupation: Working Careers adviser  . Financial resource strain: Hard  . Food insecurity    Worry: Sometimes true    Inability: Sometimes true  . Transportation needs    Medical: No    Non-medical: No  Tobacco Use  . Smoking status: Never Smoker  . Smokeless tobacco: Never Used  Substance and Sexual Activity  . Alcohol use: No  . Drug use: No  . Sexual activity: Not on file  Lifestyle  . Physical activity    Days per week: 5 days    Minutes per session: 60 min  . Stress: Only a little  Relationships  . Social Herbalist on phone: Once a week    Gets together: Once a week    Attends religious service: More than 4 times per year    Active member of club or organization: No    Attends meetings of clubs or organizations: Never    Relationship status: Never married  . Intimate partner violence    Fear of current or ex partner: Patient refused    Emotionally abused: Patient refused    Physically abused: Patient refused  Forced sexual activity: Patient refused  Other Topics Concern  . Not on file  Social History Narrative  . Not on file      Review of Systems  Constitutional: Negative.  Negative for chills, fatigue and unexpected weight change.  HENT: Negative.  Negative for congestion, rhinorrhea, sneezing and sore throat.   Eyes: Negative for redness.  Respiratory: Negative.  Negative for cough, chest tightness and shortness of breath.   Cardiovascular: Negative.  Negative for chest pain and palpitations.  Gastrointestinal: Negative.  Negative for abdominal pain, constipation, diarrhea, nausea and vomiting.  Endocrine: Negative.   Genitourinary: Negative.  Negative for dysuria and  frequency.  Musculoskeletal: Positive for back pain. Negative for arthralgias, joint swelling and neck pain.  Skin: Negative.  Negative for rash.  Allergic/Immunologic: Negative.   Neurological: Negative.  Negative for tremors and numbness.  Hematological: Negative for adenopathy. Does not bruise/bleed easily.  Psychiatric/Behavioral: Negative.  Negative for behavioral problems, sleep disturbance and suicidal ideas. The patient is not nervous/anxious.     Vital Signs: BP 109/68   Pulse (!) 55   Temp (!) 97.1 F (36.2 C)   Ht 6' (1.829 m)   Wt 165 lb (74.8 kg)   BMI 22.38 kg/m    Observation/Objective:  Well appearing, NAD noted.  Speaking in full sentences, conversing with ease.   Assessment/Plan: 1. Encounter for general adult medical examination with abnormal findings Up to date on PHM.  Labs reviewed with patient.   2. Chronic bilateral low back pain without sciatica RX for massage therapy, as well as Chiropractor provided to patient.  3. Elevated TSH Pts TSH level has doubled in the last year.  He remains within normal limits, and I do not have a Free T4 from last year to compare, will repeat in 6 months.   4. Exposure to Covid-19 Virus AB test at patient request. - SAR CoV2 Serology (COVID 19)AB(IGG)IA  General Counseling: Mayfield verbalizes understanding of the findings of today's phone visit and agrees with plan of treatment. I have discussed any further diagnostic evaluation that may be needed or ordered today. We also reviewed his medications today. he has been encouraged to call the office with any questions or concerns that should arise related to todays visit.    No orders of the defined types were placed in this encounter.   No orders of the defined types were placed in this encounter.   Time spent: 15 Minutes    Blima LedgerAdam Maame Dack AGNP-C Internal medicine

## 2019-04-25 ENCOUNTER — Telehealth: Payer: Self-pay | Admitting: Physician Assistant

## 2019-04-25 DIAGNOSIS — J069 Acute upper respiratory infection, unspecified: Secondary | ICD-10-CM

## 2019-04-25 DIAGNOSIS — B9789 Other viral agents as the cause of diseases classified elsewhere: Secondary | ICD-10-CM

## 2019-04-25 DIAGNOSIS — J029 Acute pharyngitis, unspecified: Secondary | ICD-10-CM

## 2019-04-25 MED ORDER — PHENOL 1.4 % MT LIQD: 1.0000 | OROMUCOSAL | 0 refills | Status: DC | PRN

## 2019-04-25 MED ORDER — BENZONATATE 100 MG PO CAPS: 100.0000 mg | ORAL_CAPSULE | Freq: Three times a day (TID) | ORAL | 0 refills | Status: DC | PRN

## 2019-04-25 MED ORDER — ACETAMINOPHEN 500 MG PO TABS: 500.0000 mg | ORAL_TABLET | Freq: Four times a day (QID) | ORAL | 0 refills | Status: DC | PRN

## 2019-04-25 MED ORDER — FLUTICASONE PROPIONATE 50 MCG/ACT NA SUSP: 2.0000 | Freq: Every day | NASAL | 0 refills | Status: DC

## 2019-04-25 MED ORDER — IBUPROFEN 600 MG PO TABS: 600.0000 mg | ORAL_TABLET | Freq: Three times a day (TID) | ORAL | 0 refills | Status: DC | PRN

## 2019-04-25 NOTE — Progress Notes (Signed)
We are sorry you are not feeling well.  Here is how we plan to help!  Based on what you have shared with me, it looks like you may have a viral upper respiratory infection.  Upper respiratory infections are caused by a large number of viruses; however, rhinovirus is the most common cause.   Your symptoms could suggest possible COVID-19 infection.  Many health care providers can now test patients at their office but not all are.  [place] has multiple testing sites. For information on our COVID testing locations and hours go to [email or web address]   You can also call  [phone] for further information regarding local testing options offered through [facility].    Symptoms vary from person to person, with common symptoms including sore throat, cough, fatigue or lack of energy and feeling of general discomfort.  A low-grade fever of up to 100.4 may present, but is often uncommon.  Symptoms vary however, and are closely related to a person's age or underlying illnesses.  The most common symptoms associated with an upper respiratory infection are nasal discharge or congestion, cough, sneezing, headache and pressure in the ears and face.  These symptoms usually persist for about 3 to 10 days, but can last up to 2 weeks.  It is important to know that upper respiratory infections do not cause serious illness or complications in most cases.    Upper respiratory infections can be transmitted from person to person, with the most common method of transmission being a person's hands.  The virus is able to live on the skin and can infect other persons for up to 2 hours after direct contact.  Also, these can be transmitted when someone coughs or sneezes; thus, it is important to cover the mouth to reduce this risk.  To keep the spread of the illness at [place], good hand hygiene is very important.  This is an infection that is most likely caused by a virus. There are no specific  treatments other than to help you with the symptoms until the infection runs its course.  We are sorry you are not feeling well.  Here is how we plan to help!   For nasal congestion, you may use an oral decongestants such as Mucinex D or if you have glaucoma or high blood pressure use plain Mucinex.  Saline nasal spray or nasal drops can help and can safely be used as often as needed for congestion.  For your congestion, I have prescribed Fluticasone nasal spray one spray in each nostril twice a day  If you do not have a history of heart disease, hypertension, diabetes or thyroid disease, prostate/bladder issues or glaucoma, you may also use Sudafed to treat nasal congestion.  It is highly recommended that you consult with a pharmacist or your primary care physician to ensure this medication is safe for you to take.     If you have a cough, you may use cough suppressants such as Delsym and Robitussin.  If you have glaucoma or high blood pressure, you can also use Coricidin HBP.   For cough I have prescribed for you A prescription cough medication called Tessalon Perles 100 mg. You may take 1-2 capsules every 8 hours as needed for cough  If you have a sore or scratchy throat, use a saltwater gargle-  to  teaspoon of salt dissolved in a 4-ounce to 8-ounce glass of warm water.  Gargle the solution for approximately 15-30 seconds and then spit.  It is  important not to swallow the solution.  You can also use throat lozenges/cough drops and Chloraseptic spray (which I have also prescribed) to help with throat pain or discomfort.  Warm or cold liquids can also be helpful in relieving throat pain.  For headache, pain or general discomfort, you can use Ibuprofen or Tylenol as directed.   Some authorities believe that zinc sprays or the use of Echinacea may shorten the course of your symptoms.   HOME CARE . Only take medications as instructed by your medical team. . Be sure to drink plenty of fluids. Water  is fine as well as fruit juices, sodas and electrolyte beverages. You may want to stay away from caffeine or alcohol. If you are nauseated, try taking small sips of liquids. How do you know if you are getting enough fluid? Your urine should be a pale yellow or almost colorless. . Get rest. . Taking a steamy shower or using a humidifier may help nasal congestion and ease sore throat pain. You can place a towel over your head and breathe in the steam from hot water coming from a faucet. . Using a saline nasal spray works much the same way. . Cough drops, hard candies and sore throat lozenges may ease your cough. . Avoid close contacts especially the very young and the elderly . Cover your mouth if you cough or sneeze . Always remember to wash your hands.   GET HELP RIGHT AWAY IF: . You develop worsening fever. . If your symptoms do not improve within 10 days . You develop yellow or green discharge from your nose over 3 days. . You have coughing fits . You develop a severe head ache or visual changes. . You develop shortness of breath, difficulty breathing or start having chest pain . Your symptoms persist after you have completed your treatment plan  MAKE SURE YOU   Understand these instructions.  Will watch your condition.  Will get help right away if you are not doing well or get worse.  Your e-visit answers were reviewed by a board certified advanced clinical practitioner to complete your personal care plan. Depending upon the condition, your plan could have included both over the counter or prescription medications. Please review your pharmacy choice. If there is a problem, you may call our nursing hot line at and have the prescription routed to another pharmacy. Your safety is important to [place]. If you have drug allergies check your prescription carefully.   You can use MyChart to ask questions about today's visit, request a non-urgent call back, or ask for a work or school excuse for  24 hours related to this e-Visit. If it has been greater than 24 hours you will need to follow up with your provider, or enter a new e-Visit to address those concerns. You will get an e-mail in the next two days asking about your experience.  I hope that your e-visit has been valuable and will speed your recovery. Thank you for using e-visits.  Greater than 5 minutes, yet less than 10 minutes of time have been spent researching, coordinating, and implementing care for this patient today.

## 2019-04-26 ENCOUNTER — Other Ambulatory Visit: Payer: Self-pay

## 2019-04-26 DIAGNOSIS — Z20822 Contact with and (suspected) exposure to covid-19: Secondary | ICD-10-CM

## 2019-04-27 LAB — EUROIMMUN SARS-COV-2 AB, IGG: Euroimmun SARS-CoV-2 Ab, IgG: UNDETERMINED

## 2019-04-27 LAB — NOVEL CORONAVIRUS, NAA: SARS-CoV-2, NAA: NOT DETECTED

## 2019-04-27 LAB — SAR COV2 SEROLOGY (COVID19)AB(IGG),IA

## 2019-06-08 ENCOUNTER — Other Ambulatory Visit: Payer: Self-pay | Admitting: *Deleted

## 2019-06-08 DIAGNOSIS — Z20822 Contact with and (suspected) exposure to covid-19: Secondary | ICD-10-CM

## 2019-06-09 LAB — NOVEL CORONAVIRUS, NAA: SARS-CoV-2, NAA: NOT DETECTED

## 2019-06-15 ENCOUNTER — Telehealth: Payer: Self-pay

## 2019-06-15 NOTE — Telephone Encounter (Signed)
Called patient lmom informing of appointment. klh

## 2019-06-19 ENCOUNTER — Other Ambulatory Visit: Payer: Self-pay

## 2019-06-19 ENCOUNTER — Ambulatory Visit: Payer: BC Managed Care – PPO | Admitting: Adult Health

## 2019-06-19 ENCOUNTER — Encounter: Payer: Self-pay | Admitting: Adult Health

## 2019-06-19 VITALS — Ht 72.0 in | Wt 160.0 lb

## 2019-06-19 DIAGNOSIS — M791 Myalgia, unspecified site: Secondary | ICD-10-CM

## 2019-06-19 DIAGNOSIS — R7989 Other specified abnormal findings of blood chemistry: Secondary | ICD-10-CM | POA: Diagnosis not present

## 2019-06-19 DIAGNOSIS — R5383 Other fatigue: Secondary | ICD-10-CM | POA: Diagnosis not present

## 2019-06-19 NOTE — Progress Notes (Signed)
[facility] [address] [place], [place] [address]  Internal MEDICINE  Telephone Visit  Patient Name: Vincent Huffman  [id number]  [id number]  Date of Service: 06/19/2019  I connected with the patient at 436 by telephone and verified the patients identity using two identifiers.   I discussed the limitations, risks, security and privacy concerns of performing an evaluation and management service by telephone and the availability of in person appointments. I also discussed with the patient that there may be a patient responsible charge related to the service.  The patient expressed understanding and agrees to proceed.    Chief Complaint  Patient presents with  . Follow-up    HPI Pt seen for follow up.  Overall patient is doing well.  At last blood draw his TSH was found to be increased from previous years results. We discussed redrawing this for re-evaluation at this time. He is also requesting his testosterone be evaluated as he has been having some fatigue intermittently.  He also reports some muscle pain at times, however he feels like this is minor at this time.        Current Medication: Outpatient Encounter Medications as of 06/19/2019  Medication Sig  . [DISCONTINUED] acetaminophen (TYLENOL) 500 MG tablet Take 1 tablet (500 mg total) by mouth every 6 (six) hours as needed. (Patient not taking: Reported on 06/19/2019)  . [DISCONTINUED] benzonatate (TESSALON) 100 MG capsule Take 1 capsule (100 mg total) by mouth 3 (three) times daily as needed for cough. (Patient not taking: Reported on 06/19/2019)  . [DISCONTINUED] fluticasone (FLONASE) 50 MCG/ACT nasal spray Place 2 sprays into both nostrils daily. (Patient not taking: Reported on 06/19/2019)  . [DISCONTINUED] ibuprofen (ADVIL) 600 MG tablet Take 1 tablet (600 mg total) by mouth every 8 (eight) hours as needed. (Patient not taking: Reported on 06/19/2019)  . [DISCONTINUED] phenol (CHLORASEPTIC) 1.4 % LIQD Use as  directed 1 spray in the mouth or throat as needed for throat irritation / pain. (Patient not taking: Reported on 06/19/2019)  . [DISCONTINUED] ranitidine (ZANTAC) 150 MG capsule Take 1 capsule (150 mg total) by mouth 2 (two) times daily.   No facility-administered encounter medications on file as of 06/19/2019.     Surgical History: Past Surgical History:  Procedure Laterality Date  . ADENOIDECTOMY    . TONSILLECTOMY      Medical History: Past Medical History:  Diagnosis Date  . Chronic reflux esophagitis   . Degenerative joint disease (DJD) of lumbar spine   . Esophagitis   . GERD (gastroesophageal reflux disease)     Family History: Family History  Problem Relation Age of Onset  . Heart disease Mother   . Heart disease Father   . Diabetes Maternal Grandfather     Social History   Socioeconomic History  . Marital status: Single    Spouse name: Not on file  . Number of children: Not on file  . Years of education: Not on file  . Highest education level: Not on file  Occupational History  . Occupation: Working Chief Executive Officer  . Financial resource strain: Hard  . Food insecurity    Worry: Sometimes true    Inability: Sometimes true  . Transportation needs    Medical: No    Non-medical: No  Tobacco Use  . Smoking status: Never Smoker  . Smokeless tobacco: Never Used  Substance and Sexual Activity  . Alcohol use: No  . Drug use: No  . Sexual activity: Not on  file  Lifestyle  . Physical activity    Days per week: 5 days    Minutes per session: 60 min  . Stress: Only a little  Relationships  . Social Herbalist on phone: Once a week    Gets together: Once a week    Attends religious service: More than 4 times per year    Active member of club or organization: No    Attends meetings of clubs or organizations: Never    Relationship status: Never married  . Intimate partner violence    Fear of current or ex partner: Patient refused     Emotionally abused: Patient refused    Physically abused: Patient refused    Forced sexual activity: Patient refused  Other Topics Concern  . Not on file  Social History Narrative  . Not on file      Review of Systems  Constitutional: Negative.  Negative for chills, fatigue and unexpected weight change.  HENT: Negative.  Negative for congestion, rhinorrhea, sneezing and sore throat.   Eyes: Negative for redness.  Respiratory: Negative.  Negative for cough, chest tightness and shortness of breath.   Cardiovascular: Negative.  Negative for chest pain and palpitations.  Gastrointestinal: Negative.  Negative for abdominal pain, constipation, diarrhea, nausea and vomiting.  Endocrine: Negative.   Genitourinary: Negative.  Negative for dysuria and frequency.  Musculoskeletal: Negative.  Negative for arthralgias, back pain, joint swelling and neck pain.  Skin: Negative.  Negative for rash.  Allergic/Immunologic: Negative.   Neurological: Negative.  Negative for tremors and numbness.  Hematological: Negative for adenopathy. Does not bruise/bleed easily.  Psychiatric/Behavioral: Negative.  Negative for behavioral problems, sleep disturbance and suicidal ideas. The patient is not nervous/anxious.     Vital Signs: Ht 6' (1.829 m)   Wt 160 lb (72.6 kg)   BMI 21.70 kg/m    Observation/Objective:  Well appearing, NAD noted.    Assessment/Plan: 1. Elevated TSH Recheck TSH and FT4, follow up in 4 weeks.  - TSH + free T4  2. Other fatigue Check Testosterone and prolactin at this time, will follow up as scheduled.  - Testosterone,Free and Total - Prolactin  3. Muscle pain Continue  To follow.  General Counseling: Vincent Huffman verbalizes understanding of the findings of today's phone visit and agrees with plan of treatment. I have discussed any further diagnostic evaluation that may be needed or ordered today. We also reviewed his medications today. he has been encouraged to call the  office with any questions or concerns that should arise related to todays visit.    No orders of the defined types were placed in this encounter.   No orders of the defined types were placed in this encounter.   Time spent: [facility] Internal medicine

## 2019-07-07 ENCOUNTER — Encounter: Payer: Self-pay | Admitting: Emergency Medicine

## 2019-07-07 ENCOUNTER — Other Ambulatory Visit: Payer: Self-pay

## 2019-07-07 ENCOUNTER — Emergency Department
Admission: EM | Admit: 2019-07-07 | Discharge: 2019-07-07 | Disposition: A | Discharge status: Home / Self Care | Payer: BC Managed Care – PPO | Attending: Emergency Medicine | Admitting: Emergency Medicine

## 2019-07-07 DIAGNOSIS — R1110 Vomiting, unspecified: Secondary | ICD-10-CM | POA: Diagnosis not present

## 2019-07-07 DIAGNOSIS — T18128A Food in esophagus causing other injury, initial encounter: Secondary | ICD-10-CM | POA: Diagnosis not present

## 2019-07-07 DIAGNOSIS — Y92019 Unspecified place in single-family (private) house as the place of occurrence of the external cause: Secondary | ICD-10-CM | POA: Diagnosis not present

## 2019-07-07 DIAGNOSIS — X58XXXA Exposure to other specified factors, initial encounter: Secondary | ICD-10-CM | POA: Diagnosis not present

## 2019-07-07 DIAGNOSIS — Y93G1 Activity, food preparation and clean up: Secondary | ICD-10-CM | POA: Diagnosis not present

## 2019-07-07 DIAGNOSIS — Y998 Other external cause status: Secondary | ICD-10-CM | POA: Insufficient documentation

## 2019-07-07 NOTE — Discharge Instructions (Addendum)
Follow-up with GI 

## 2019-07-07 NOTE — ED Triage Notes (Signed)
Patient to ER for c/o roast beef stuck in throat. Patient currently vomiting, but unable to get piece of food out.

## 2019-07-07 NOTE — ED Provider Notes (Signed)
[facility] Emergency Department Provider Note  ____________________________________________   First MD Initiated Contact with Patient 07/07/19 [phone]     (approximate)  I have reviewed the triage vital signs and the nursing notes.   HISTORY  Chief Complaint Foreign Body    HPI Vincent Huffman is a 43 y.o. male otherwise healthy who presents with concern for roast beef stuck in his throat.  Patient initially presented at 9 AM due to concern of roast beef stuck in his throat.  Patient was vomiting.  Patient symptoms have been severe, constant, nothing makes it better, nothing makes it worse.  While patient was waiting for room he says that he felt it spontaneously passed has not had any vomiting since.  Patient says that he has had this happen previously worse had to have GI remove it.  He has been followed up with GI and they are unsure what is happening.  Patient states that he is at his baseline at this time and is requesting discharge home.          Past Medical History:  Diagnosis Date  . Chronic reflux esophagitis   . Degenerative joint disease (DJD) of lumbar spine   . Esophagitis   . GERD (gastroesophageal reflux disease)     Patient Active Problem List   Diagnosis Date Noted  . Healthcare maintenance 12/16/2017    Past Surgical History:  Procedure Laterality Date  . ADENOIDECTOMY    . TONSILLECTOMY      Prior to Admission medications   Medication Sig Start Date End Date Taking? Authorizing Provider  ranitidine (ZANTAC) 150 MG capsule Take 1 capsule (150 mg total) by mouth 2 (two) times daily. 07/08/15 07/08/15  Sharman Cheek, MD    Allergies Iodinated diagnostic agents  Family History  Problem Relation Age of Onset  . Heart disease Mother   . Heart disease Father   . Diabetes Maternal Grandfather     Social History Social History   Tobacco Use  . Smoking status: Never Smoker  . Smokeless tobacco: Never Used  Substance  Use Topics  . Alcohol use: No  . Drug use: No      Review of Systems Constitutional: No fever/chills Eyes: No visual changes. ENT: No sore throat. Cardiovascular: Denies chest pain. Respiratory: Denies shortness of breath. Gastrointestinal: No abdominal pain. No diarrhea.  No constipation.  Positive sensation of food stuck in his throat, vomiting Genitourinary: Negative for dysuria. Musculoskeletal: Negative for back pain. Skin: Negative for rash. Neurological: Negative for headaches, focal weakness or numbness. All other ROS negative ____________________________________________   PHYSICAL EXAM:  VITAL SIGNS: ED Triage Vitals  Enc Vitals Group     BP 07/07/19 0908 128/68     Pulse Rate 07/07/19 0908 98     Resp 07/07/19 0908 18     Temp 07/07/19 0908 98.2 F (36.8 C)     Temp Source 07/07/19 0908 Oral     SpO2 07/07/19 0908 100 %     Weight 07/07/19 0913 160 lb 0.9 oz (72.6 kg)     Height 07/07/19 0913 6\' 2"  (1.88 m)     Head Circumference --      Peak Flow --      Pain Score 07/07/19 0914 8     Pain Loc --      Pain Edu? --      Excl. in GC? --     Constitutional: Alert and oriented. Well appearing and in no acute distress. Eyes: Conjunctivae  are normal. EOMI. Head: Atraumatic. Nose: No congestion/rhinnorhea. Mouth/Throat: Mucous membranes are moist.   Neck: No stridor. Trachea Midline. FROM Cardiovascular: Normal rate, regular rhythm. Grossly normal heart sounds.  Good peripheral circulation. Respiratory: Normal respiratory effort.  No retractions. Lungs CTAB. Gastrointestinal: Soft and nontender. No distention. No abdominal bruits.  Musculoskeletal: No lower extremity tenderness nor edema.  No joint effusions. Neurologic:  Normal speech and language. No gross focal neurologic deficits are appreciated.  Skin:  Skin is warm, dry and intact. No rash noted. Psychiatric: Mood and affect are normal. Speech and behavior are normal. GU: Deferred    ____________________________________________ = INITIAL IMPRESSION / ASSESSMENT AND PLAN / ED COURSE  Vincent Huffman was evaluated in Emergency Department on 07/07/2019 for the symptoms described in the history of present illness. He was evaluated in the context of the global COVID-19 pandemic, which necessitated consideration that the patient might be at risk for infection with the SARS-CoV-2 virus that causes COVID-19. Institutional protocols and algorithms that pertain to the evaluation of patients at risk for COVID-19 are in a state of rapid change based on information released by regulatory bodies including the CDC and federal and state organizations. These policies and algorithms were followed during the patient's care in the ED.    Patient initially presented with food impaction.  Patient says glucagon in the past has helped him.  He is wondering if there is a way to get this at home.  Explained to him that it has to be given IV and that there is not a way to do that at home.  Patient says that in his past spontaneously at this time.  He is ready for discharge home.  He has no further signs of vomiting.  Patient given GIs number to follow-up outpatient for endoscopy in case this is secondary to stricture, tumor.        ____________________________________________   FINAL CLINICAL IMPRESSION(S) / ED DIAGNOSES   Final diagnoses:  Food impaction of esophagus, initial encounter      MEDICATIONS GIVEN DURING THIS VISIT:  Medications - No data to display   ED Discharge Orders    None       Note:  This document was prepared using Dragon voice recognition software and may include unintentional dictation errors.   Vanessa Piedra Aguza, MD 07/07/19 1134

## 2019-07-07 NOTE — ED Notes (Signed)
Pt feels like the food has passed

## 2019-07-19 ENCOUNTER — Telehealth: Payer: Self-pay

## 2019-07-19 NOTE — Telephone Encounter (Signed)
LMOM FOR PATIENT TO CONFIRM 07-21-19 OV AS VIRTUAL.

## 2019-07-21 ENCOUNTER — Ambulatory Visit: Payer: BC Managed Care – PPO | Admitting: Nurse Practitioner

## 2019-07-27 ENCOUNTER — Other Ambulatory Visit: Payer: Self-pay

## 2019-07-27 ENCOUNTER — Ambulatory Visit: Payer: BC Managed Care – PPO | Admitting: Adult Health

## 2019-07-27 VITALS — BP 108/76 | HR 65 | Temp 97.4°F | Resp 18 | Ht 72.0 in | Wt 171.0 lb

## 2019-07-27 DIAGNOSIS — M62838 Other muscle spasm: Secondary | ICD-10-CM | POA: Diagnosis not present

## 2019-07-27 DIAGNOSIS — M791 Myalgia, unspecified site: Secondary | ICD-10-CM | POA: Diagnosis not present

## 2019-07-27 MED ORDER — TIZANIDINE HCL 2 MG PO CAPS: 2.0000 mg | ORAL_CAPSULE | Freq: Two times a day (BID) | ORAL | 0 refills | Status: DC | PRN

## 2019-07-27 MED ORDER — CYCLOBENZAPRINE HCL 5 MG PO TABS: 5.0000 mg | ORAL_TABLET | Freq: Three times a day (TID) | ORAL | 0 refills | Status: DC | PRN

## 2019-07-27 NOTE — Progress Notes (Signed)
[facility] [place], [place] [address]  Internal MEDICINE  Office Visit Note  Patient Name: Vincent Huffman  [id number]  [id number]  Date of Service: 07/27/2019  Chief Complaint  Patient presents with  . Muscle Pain     HPI Pt is here for a sick visit. He reports 4-5 days of Upper back pain and spasm.  He has tried being adjusted by the chiropractor multiple times, has had massage with some relief.  He has taken otc meds and used multiple herbal supplements to help relax this muscle with no complete relief.     Current Medication:  Outpatient Encounter Medications as of 07/27/2019  Medication Sig  . cyclobenzaprine (FLEXERIL) 5 MG tablet Take 1 tablet (5 mg total) by mouth 3 (three) times daily as needed for muscle spasms.  . tizanidine (ZANAFLEX) 2 MG capsule Take 1 capsule (2 mg total) by mouth 2 (two) times daily as needed for muscle spasms.  . [DISCONTINUED] ranitidine (ZANTAC) 150 MG capsule Take 1 capsule (150 mg total) by mouth 2 (two) times daily.   No facility-administered encounter medications on file as of 07/27/2019.      Medical History: Past Medical History:  Diagnosis Date  . Chronic reflux esophagitis   . Degenerative joint disease (DJD) of lumbar spine   . Esophagitis   . GERD (gastroesophageal reflux disease)      Vital Signs: BP 108/76   Pulse 65   Temp (!) 97.4 F (36.3 C)   Resp 18   Ht 6' (1.829 m)   Wt 171 lb (77.6 kg)   SpO2 98%   BMI 23.19 kg/m    Review of Systems  Constitutional: Negative.  Negative for chills, fatigue and unexpected weight change.  HENT: Negative.  Negative for congestion, rhinorrhea, sneezing and sore throat.   Eyes: Negative for redness.  Respiratory: Negative.  Negative for cough, chest tightness and shortness of breath.   Cardiovascular: Negative.  Negative for chest pain and palpitations.  Gastrointestinal: Negative.  Negative for abdominal pain, constipation, diarrhea, nausea  and vomiting.  Endocrine: Negative.   Genitourinary: Negative.  Negative for dysuria and frequency.  Musculoskeletal: Positive for back pain. Negative for arthralgias, joint swelling and neck pain.  Skin: Negative.  Negative for rash.  Allergic/Immunologic: Negative.   Neurological: Negative.  Negative for tremors and numbness.  Hematological: Negative for adenopathy. Does not bruise/bleed easily.  Psychiatric/Behavioral: Negative.  Negative for behavioral problems, sleep disturbance and suicidal ideas. The patient is not nervous/anxious.     Physical Exam Vitals and nursing note reviewed.  Constitutional:      General: He is not in acute distress.    Appearance: He is well-developed. He is not diaphoretic.  HENT:     Head: Normocephalic and atraumatic.     Mouth/Throat:     Pharynx: No oropharyngeal exudate.  Eyes:     Pupils: Pupils are equal, round, and reactive to light.  Neck:     Thyroid: No thyromegaly.     Vascular: No JVD.     Trachea: No tracheal deviation.  Cardiovascular:     Rate and Rhythm: Normal rate and regular rhythm.     Heart sounds: Normal heart sounds. No murmur. No friction rub. No gallop.   Pulmonary:     Effort: Pulmonary effort is normal. No respiratory distress.     Breath sounds: Normal breath sounds. No wheezing or rales.  Chest:     Chest wall: No tenderness.  Abdominal:  Palpations: Abdomen is soft.     Tenderness: There is no abdominal tenderness. There is no guarding.  Musculoskeletal:        General: Normal range of motion.     Cervical back: Normal range of motion and neck supple.  Lymphadenopathy:     Cervical: No cervical adenopathy.  Skin:    General: Skin is warm and dry.  Neurological:     Mental Status: He is alert and oriented to person, place, and time.     Cranial Nerves: No cranial nerve deficit.  Psychiatric:        Behavior: Behavior normal.        Thought Content: Thought content normal.        Judgment: Judgment  normal.    Assessment/Plan: 1. Muscle pain Continue to use heat for 20 minutes on , 2 hours off.  Can use OTC anti-inflammatory medications.  Also can use topical analgesic like icy hot.  Take muscle relaxer's as indicated.   2. Muscle spasm Instructed patient to not drive or operate any machinery while taking medication.  Also do not mix with other medications or alcohol. Follow up in office in one week if symptoms fail to improve. - cyclobenzaprine (FLEXERIL) 5 MG tablet; Take 1 tablet (5 mg total) by mouth 3 (three) times daily as needed for muscle spasms.  Dispense: 20 tablet; Refill: 0 - tizanidine (ZANAFLEX) 2 MG capsule; Take 1 capsule (2 mg total) by mouth 2 (two) times daily as needed for muscle spasms.  Dispense: 15 capsule; Refill: 0  General Counseling: Vedanth verbalizes understanding of the findings of todays visit and agrees with plan of treatment. I have discussed any further diagnostic evaluation that may be needed or ordered today. We also reviewed his medications today. he has been encouraged to call the office with any questions or concerns that should arise related to todays visit.   No orders of the defined types were placed in this encounter.   Meds ordered this encounter  Medications  . cyclobenzaprine (FLEXERIL) 5 MG tablet    Sig: Take 1 tablet (5 mg total) by mouth 3 (three) times daily as needed for muscle spasms.    Dispense:  20 tablet    Refill:  0  . tizanidine (ZANAFLEX) 2 MG capsule    Sig: Take 1 capsule (2 mg total) by mouth 2 (two) times daily as needed for muscle spasms.    Dispense:  15 capsule    Refill:  0    Time spent: 25 Minutes  This patient was seen by Blima Ledger AGNP-C in Collaboration with Dr Lyndon Code as a part of collaborative care agreement.  Johnna Acosta AGNP-C Internal Medicine

## 2019-08-15 ENCOUNTER — Ambulatory Visit: Payer: BC Managed Care – PPO | Admitting: Nurse Practitioner

## 2019-08-21 ENCOUNTER — Ambulatory Visit: Payer: BC Managed Care – PPO | Admitting: Nurse Practitioner

## 2019-08-24 ENCOUNTER — Ambulatory Visit: Payer: BC Managed Care – PPO | Attending: Internal Medicine

## 2019-08-24 ENCOUNTER — Other Ambulatory Visit: Payer: BC Managed Care – PPO

## 2019-08-24 DIAGNOSIS — Z20822 Contact with and (suspected) exposure to covid-19: Secondary | ICD-10-CM

## 2019-08-25 LAB — NOVEL CORONAVIRUS, NAA: SARS-CoV-2, NAA: NOT DETECTED

## 2020-02-19 ENCOUNTER — Telehealth: Payer: BC Managed Care – PPO | Admitting: Emergency Medicine

## 2020-02-19 DIAGNOSIS — M545 Low back pain, unspecified: Secondary | ICD-10-CM

## 2020-02-19 MED ORDER — BACLOFEN 10 MG PO TABS: 10.0000 mg | ORAL_TABLET | Freq: Three times a day (TID) | ORAL | 0 refills | Status: DC | PRN

## 2020-02-19 MED ORDER — NAPROXEN 500 MG PO TABS: 500.0000 mg | ORAL_TABLET | Freq: Two times a day (BID) | ORAL | 0 refills | Status: DC

## 2020-02-19 MED ORDER — METHYLPREDNISOLONE 4 MG PO TBPK: ORAL_TABLET | ORAL | 0 refills | Status: DC

## 2020-02-19 NOTE — Progress Notes (Signed)
We are sorry that you are not feeling well.  Here is how we plan to help!  Based on what you have shared with me it looks like you mostly have acute back pain.  Acute back pain is defined as musculoskeletal pain that can resolve in 1-3 weeks with conservative treatment.  I have prescribed Naprosyn 500 mg take one by mouth twice a day non-steroid anti-inflammatory (NSAID) as well as Baclofen 10 mg every eight hours as needed which is a muscle relaxer  Further I have prescribed a medrol dosepack Use as directed  Some patients experience stomach irritation or in increased heartburn with anti-inflammatory drugs.  Please keep in mind that muscle relaxer's can cause fatigue and should not be taken while at work or driving.  Back pain is very common.  The pain often gets better over time.  The cause of back pain is usually not dangerous.  Most people can learn to manage their back pain on their own.  Home Care  Stay active.  Start with short walks on flat ground if you can.  Try to walk farther each day.  Do not sit, drive or stand in one place for more than 30 minutes.  Do not stay in bed.  Do not avoid exercise or work.  Activity can help your back heal faster.  Be careful when you bend or lift an object.  Bend at your knees, keep the object close to you, and do not twist.  Sleep on a firm mattress.  Lie on your side, and bend your knees.  If you lie on your back, put a pillow under your knees.  Only take medicines as told by your doctor.  Put ice on the injured area.  Put ice in a plastic bag  Place a towel between your skin and the bag  Leave the ice on for 15-20 minutes, 3-4 times a day for the first 2-3 days. 210 After that, you can switch between ice and heat packs.  Ask your doctor about back exercises or massage.  Avoid feeling anxious or stressed.  Find good ways to deal with stress, such as exercise.  Get Help Right Way If:  Your pain does not go away with rest or  medicine.  Your pain does not go away in 1 week.  You have new problems.  You do not feel well.  The pain spreads into your legs.  You cannot control when you poop (bowel movement) or pee (urinate)  You feel sick to your stomach (nauseous) or throw up (vomit)  You have belly (abdominal) pain.  You feel like you may pass out (faint).  If you develop a fever.  Make Sure you:  Understand these instructions.  Will watch your condition  Will get help right away if you are not doing well or get worse.  Your e-visit answers were reviewed by a board certified advanced clinical practitioner to complete your personal care plan.  Depending on the condition, your plan could have included both over the counter or prescription medications.  If there is a problem please reply  once you have received a response from your provider.  Your safety is important to [place].  If you have drug allergies check your prescription carefully.    You can use MyChart to ask questions about today's visit, request a non-urgent call back, or ask for a work or school excuse for 24 hours related to this e-Visit. If it has been greater than 24 hours you  will need to follow up with your provider, or enter a new e-Visit to address those concerns.  You will get an e-mail in the next two days asking about your experience.  I hope that your e-visit has been valuable and will speed your recovery. Thank you for using e-visits.  **Please do not respond to this message unless you have follow up questions.** Greater than 5 but less than 10 minutes spent researching, coordinating, and implementing care for this patient today

## 2020-02-27 ENCOUNTER — Telehealth: Payer: BC Managed Care – PPO | Admitting: Family

## 2020-02-27 DIAGNOSIS — M62830 Muscle spasm of back: Secondary | ICD-10-CM

## 2020-02-27 MED ORDER — CYCLOBENZAPRINE HCL 10 MG PO TABS: 10.0000 mg | ORAL_TABLET | Freq: Three times a day (TID) | ORAL | 0 refills | Status: DC | PRN

## 2020-02-27 MED ORDER — NAPROXEN 500 MG PO TABS: 500.0000 mg | ORAL_TABLET | Freq: Two times a day (BID) | ORAL | 0 refills | Status: DC

## 2020-02-27 NOTE — Progress Notes (Signed)
We are sorry that you are not feeling well.  Here is how we plan to help!  Based on what you have shared with me it looks like you mostly have acute back pain.  Acute back pain is defined as musculoskeletal pain that can resolve in 1-3 weeks with conservative treatment.  I have prescribed Naprosyn 500 mg take one by mouth twice a day non-steroid anti-inflammatory (NSAID) as well as Flexeril 10 mg every eight hours as needed which is a muscle relaxer. Tresa Garter is a controlled medication so it can not be prescribed virtually.  Some patients experience stomach irritation or in increased heartburn with anti-inflammatory drugs.  Please keep in mind that muscle relaxer's can cause fatigue and should not be taken while at work or driving.  Back pain is very common.  The pain often gets better over time.  The cause of back pain is usually not dangerous.  Most people can learn to manage their back pain on their own.  Home Care  Stay active.  Start with short walks on flat ground if you can.  Try to walk farther each day.  Do not sit, drive or stand in one place for more than 30 minutes.  Do not stay in bed.  Do not avoid exercise or work.  Activity can help your back heal faster.  Be careful when you bend or lift an object.  Bend at your knees, keep the object close to you, and do not twist.  Sleep on a firm mattress.  Lie on your side, and bend your knees.  If you lie on your back, put a pillow under your knees.  Only take medicines as told by your doctor.  Put ice on the injured area.  Put ice in a plastic bag  Place a towel between your skin and the bag  Leave the ice on for 15-20 minutes, 3-4 times a day for the first 2-3 days. 210 After that, you can switch between ice and heat packs.  Ask your doctor about back exercises or massage.  Avoid feeling anxious or stressed.  Find good ways to deal with stress, such as exercise.  Get Help Right Way If:  Your pain does not go away with rest or  medicine.  Your pain does not go away in 1 week.  You have new problems.  You do not feel well.  The pain spreads into your legs.  You cannot control when you poop (bowel movement) or pee (urinate)  You feel sick to your stomach (nauseous) or throw up (vomit)  You have belly (abdominal) pain.  You feel like you may pass out (faint).  If you develop a fever.  Make Sure you:  Understand these instructions.  Will watch your condition  Will get help right away if you are not doing well or get worse.  Your e-visit answers were reviewed by a board certified advanced clinical practitioner to complete your personal care plan.  Depending on the condition, your plan could have included both over the counter or prescription medications.  If there is a problem please reply  once you have received a response from your provider.  Your safety is important to [place].  If you have drug allergies check your prescription carefully.    You can use MyChart to ask questions about today's visit, request a non-urgent call back, or ask for a work or school excuse for 24 hours related to this e-Visit. If it has been greater than 24 hours  you will need to follow up with your provider, or enter a new e-Visit to address those concerns.  You will get an e-mail in the next two days asking about your experience.  I hope that your e-visit has been valuable and will speed your recovery. Thank you for using e-visits.   Greater than 5 minutes, yet less than 10 minutes of time have been spent researching, coordinating, and implementing care for this patient today.  Thank you for the details you included in the comment boxes. Those details are very helpful in determining the best course of treatment for you and help [place] to provide the best care.

## 2020-02-28 ENCOUNTER — Emergency Department: Payer: BC Managed Care – PPO

## 2020-02-28 ENCOUNTER — Emergency Department: Payer: BC Managed Care – PPO | Admitting: Certified Registered"

## 2020-02-28 ENCOUNTER — Emergency Department
Admission: EM | Admit: 2020-02-28 | Discharge: 2020-02-28 | Disposition: A | Discharge status: Home / Self Care | Payer: BC Managed Care – PPO | Attending: Internal Medicine | Admitting: Internal Medicine

## 2020-02-28 ENCOUNTER — Encounter
Admission: EM | Disposition: A | Discharge status: Home / Self Care | Payer: Self-pay | Source: Home / Self Care | Attending: Emergency Medicine

## 2020-02-28 ENCOUNTER — Other Ambulatory Visit: Payer: Self-pay

## 2020-02-28 DIAGNOSIS — Z791 Long term (current) use of non-steroidal anti-inflammatories (NSAID): Secondary | ICD-10-CM | POA: Insufficient documentation

## 2020-02-28 DIAGNOSIS — T18128A Food in esophagus causing other injury, initial encounter: Secondary | ICD-10-CM | POA: Insufficient documentation

## 2020-02-28 DIAGNOSIS — M1990 Unspecified osteoarthritis, unspecified site: Secondary | ICD-10-CM | POA: Insufficient documentation

## 2020-02-28 DIAGNOSIS — K2100 Gastro-esophageal reflux disease with esophagitis, without bleeding: Secondary | ICD-10-CM | POA: Insufficient documentation

## 2020-02-28 DIAGNOSIS — K222 Esophageal obstruction: Secondary | ICD-10-CM

## 2020-02-28 DIAGNOSIS — Z91041 Radiographic dye allergy status: Secondary | ICD-10-CM | POA: Diagnosis not present

## 2020-02-28 DIAGNOSIS — M47816 Spondylosis without myelopathy or radiculopathy, lumbar region: Secondary | ICD-10-CM | POA: Insufficient documentation

## 2020-02-28 DIAGNOSIS — X58XXXA Exposure to other specified factors, initial encounter: Secondary | ICD-10-CM | POA: Diagnosis not present

## 2020-02-28 DIAGNOSIS — Z7952 Long term (current) use of systemic steroids: Secondary | ICD-10-CM | POA: Insufficient documentation

## 2020-02-28 DIAGNOSIS — R0789 Other chest pain: Secondary | ICD-10-CM | POA: Diagnosis not present

## 2020-02-28 DIAGNOSIS — Z8249 Family history of ischemic heart disease and other diseases of the circulatory system: Secondary | ICD-10-CM | POA: Diagnosis not present

## 2020-02-28 DIAGNOSIS — Z20822 Contact with and (suspected) exposure to covid-19: Secondary | ICD-10-CM | POA: Insufficient documentation

## 2020-02-28 DIAGNOSIS — R1310 Dysphagia, unspecified: Secondary | ICD-10-CM | POA: Diagnosis present

## 2020-02-28 DIAGNOSIS — Z833 Family history of diabetes mellitus: Secondary | ICD-10-CM | POA: Diagnosis not present

## 2020-02-28 HISTORY — PX: ESOPHAGOGASTRODUODENOSCOPY: SHX5428

## 2020-02-28 LAB — CBC WITH DIFFERENTIAL/PLATELET
Abs Immature Granulocytes: 0.03 10*3/uL (ref 0.00–0.07)
Basophils Absolute: 0 10*3/uL (ref 0.0–0.1)
Basophils Relative: 1 %
Eosinophils Absolute: 0.2 10*3/uL (ref 0.0–0.5)
Eosinophils Relative: 3 %
HCT: 42.4 % (ref 39.0–52.0)
Hemoglobin: 14.3 g/dL (ref 13.0–17.0)
Immature Granulocytes: 0 %
Lymphocytes Relative: 26 %
Lymphs Abs: 1.9 10*3/uL (ref 0.7–4.0)
MCH: 30.8 pg (ref 26.0–34.0)
MCHC: 33.7 g/dL (ref 30.0–36.0)
MCV: 91.4 fL (ref 80.0–100.0)
Monocytes Absolute: 0.5 10*3/uL (ref 0.1–1.0)
Monocytes Relative: 7 %
Neutro Abs: 4.8 10*3/uL (ref 1.7–7.7)
Neutrophils Relative %: 63 %
Platelets: 245 10*3/uL (ref 150–400)
RBC: 4.64 MIL/uL (ref 4.22–5.81)
RDW: 12.7 % (ref 11.5–15.5)
WBC: 7.5 10*3/uL (ref 4.0–10.5)
nRBC: 0 % (ref 0.0–0.2)

## 2020-02-28 LAB — SARS CORONAVIRUS 2 BY RT PCR (HOSPITAL ORDER, PERFORMED IN ~~LOC~~ HOSPITAL LAB): SARS Coronavirus 2: NEGATIVE

## 2020-02-28 LAB — BASIC METABOLIC PANEL
Anion gap: 8 (ref 5–15)
BUN: 9 mg/dL (ref 6–20)
CO2: 30 mmol/L (ref 22–32)
Calcium: 8.9 mg/dL (ref 8.9–10.3)
Chloride: 104 mmol/L (ref 98–111)
Creatinine, Ser: 0.86 mg/dL (ref 0.61–1.24)
GFR calc Af Amer: >60 mL/min (ref >60)
GFR calc non Af Amer: >60 mL/min (ref >60)
Glucose, Bld: 85 mg/dL (ref 70–99)
Potassium: 4.3 mmol/L (ref 3.5–5.1)
Sodium: 142 mmol/L (ref 135–145)

## 2020-02-28 IMAGING — CR DG CHEST 2V
1 series · 2 of 2 positions shown · non-contrast
Comparison: None.

CLINICAL DATA: Feels like piece of steak is stuck in throat

EXAM:
CHEST - 2 VIEW

[Series 1: dg chest 2 view · 0.14mm/px · 2 of 2 slices shown]
[im 1/2]
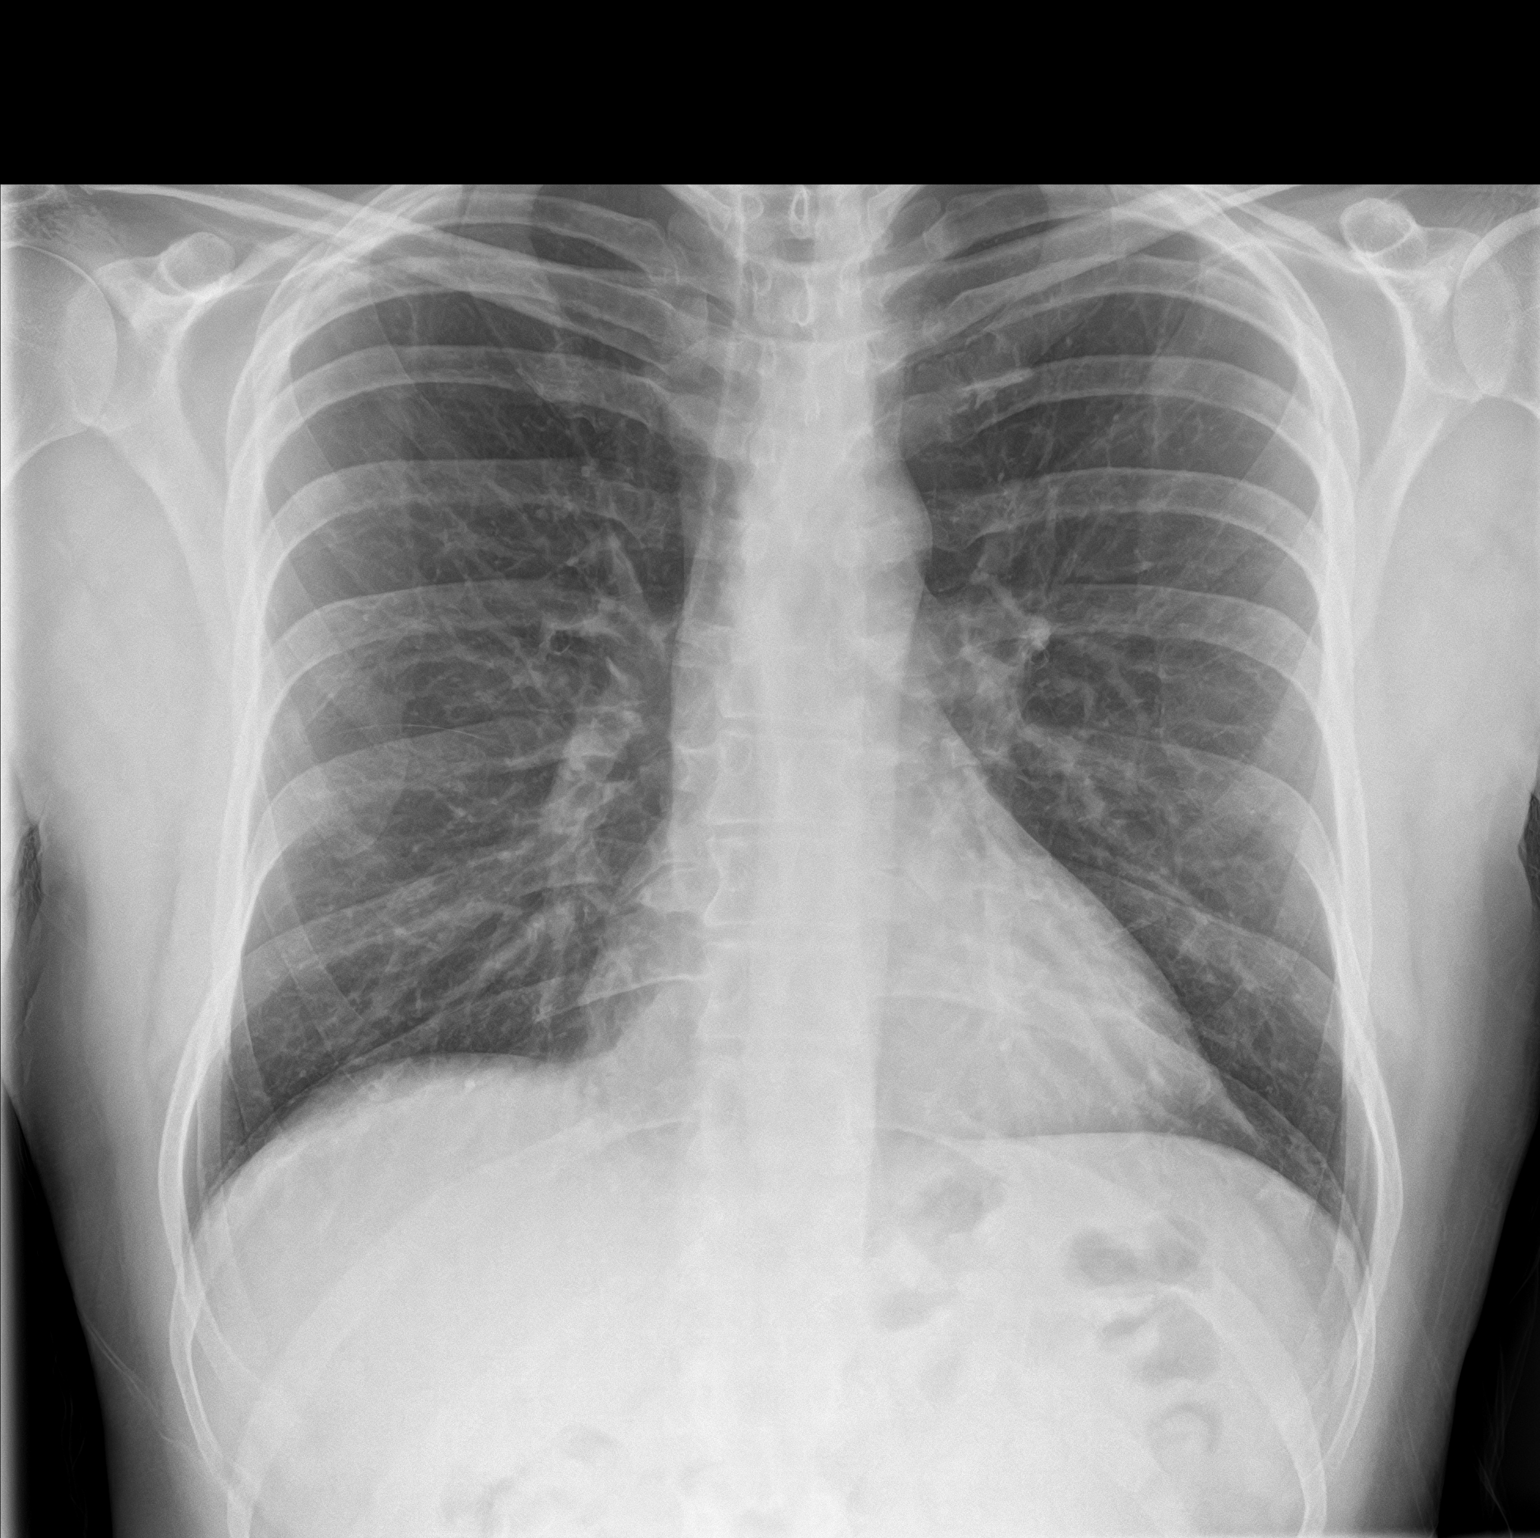
[im 2/2]
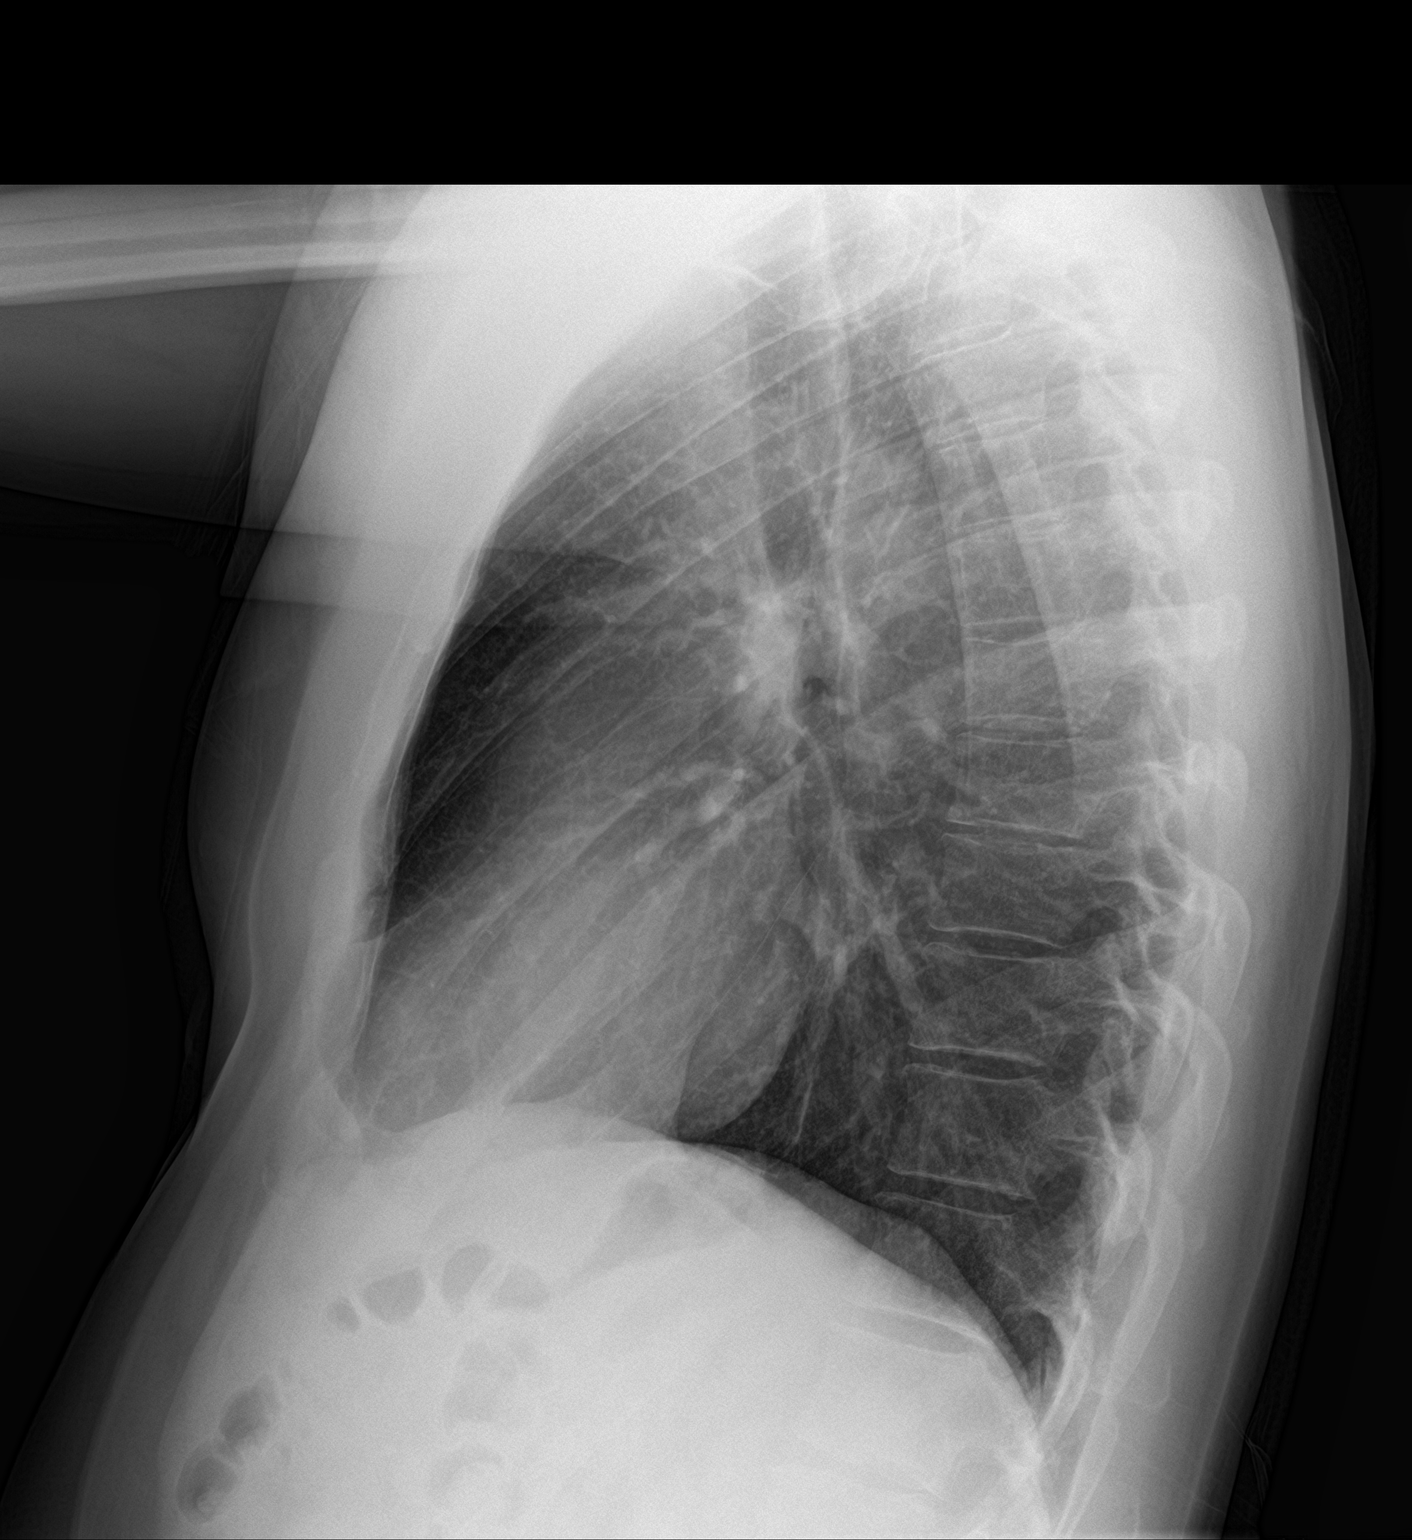

[2 of 2 positions shown; findings below may reference images not displayed]

FINDINGS: The heart size and mediastinal contours are within normal limits.
Both lungs are clear. The visualized skeletal structures are
unremarkable.
IMPRESSION: No active cardiopulmonary disease.

## 2020-02-28 SURGERY — EGD (ESOPHAGOGASTRODUODENOSCOPY)
Anesthesia: General

## 2020-02-28 MED ORDER — MIDAZOLAM HCL 2 MG/2ML IJ SOLN
INTRAMUSCULAR | Status: DC | PRN
  Administered 2020-02-28: 2 mg via INTRAVENOUS

## 2020-02-28 MED ORDER — SODIUM CHLORIDE 0.9 % IV SOLN: INTRAVENOUS | Status: DC

## 2020-02-28 MED ORDER — GLUCAGON HCL RDNA (DIAGNOSTIC) 1 MG IJ SOLR
1.0000 mg | Freq: Once | INTRAMUSCULAR | Status: AC
  Administered 2020-02-28: 1 mg via INTRAMUSCULAR
  Filled 2020-02-28: qty 1

## 2020-02-28 MED ORDER — FENTANYL CITRATE (PF) 100 MCG/2ML IJ SOLN
INTRAMUSCULAR | Status: AC
  Filled 2020-02-28: qty 2

## 2020-02-28 MED ORDER — PROPOFOL 10 MG/ML IV BOLUS
INTRAVENOUS | Status: DC | PRN
  Administered 2020-02-28: 180 mg via INTRAVENOUS

## 2020-02-28 MED ORDER — ONDANSETRON 4 MG PO TBDP
4.0000 mg | ORAL_TABLET | Freq: Once | ORAL | Status: AC
  Administered 2020-02-28: 4 mg via ORAL
  Filled 2020-02-28: qty 1

## 2020-02-28 MED ORDER — MIDAZOLAM HCL 2 MG/2ML IJ SOLN
INTRAMUSCULAR | Status: AC
  Filled 2020-02-28: qty 2

## 2020-02-28 MED ORDER — GLYCOPYRROLATE 0.2 MG/ML IJ SOLN
INTRAMUSCULAR | Status: DC | PRN
  Administered 2020-02-28: .2 mg via INTRAVENOUS

## 2020-02-28 MED ORDER — LIDOCAINE HCL (CARDIAC) PF 100 MG/5ML IV SOSY
PREFILLED_SYRINGE | INTRAVENOUS | Status: DC | PRN
  Administered 2020-02-28: 80 mg via INTRAVENOUS

## 2020-02-28 MED ORDER — SODIUM CHLORIDE 0.9 % IV SOLN
INTRAVENOUS | Status: DC
  Administered 2020-02-28: 1000 mL via INTRAVENOUS

## 2020-02-28 MED ORDER — PROPOFOL 10 MG/ML IV BOLUS
INTRAVENOUS | Status: AC
  Filled 2020-02-28: qty 20

## 2020-02-28 MED ORDER — ONDANSETRON HCL 4 MG/2ML IJ SOLN
INTRAMUSCULAR | Status: DC | PRN
  Administered 2020-02-28: 4 mg via INTRAVENOUS

## 2020-02-28 MED ORDER — FENTANYL CITRATE (PF) 100 MCG/2ML IJ SOLN: 25.0000 ug | INTRAMUSCULAR | Status: DC | PRN

## 2020-02-28 MED ORDER — FENTANYL CITRATE (PF) 100 MCG/2ML IJ SOLN
INTRAMUSCULAR | Status: DC | PRN
  Administered 2020-02-28: 50 ug via INTRAVENOUS

## 2020-02-28 MED ORDER — SUCCINYLCHOLINE CHLORIDE 20 MG/ML IJ SOLN
INTRAMUSCULAR | Status: DC | PRN
  Administered 2020-02-28: 160 mg via INTRAVENOUS

## 2020-02-28 NOTE — Discharge Instructions (Signed)
Full liquids ONLY x 2 days, then soft diet. Take Prilosec OTC 20mg  daily every day before breakfast.

## 2020-02-28 NOTE — Op Note (Signed)
[facility] Gastroenterology Patient Name: Vincent Huffman Procedure Date: 02/28/2020 6:03 PM MRN: [id number] Account #: [id number] Date of Birth: 08-23-1975 Admit Type: Outpatient Age: 44 Room: [facility] ENDO ROOM 4 Gender: Male Note Status: Finalized Procedure:             Upper GI endoscopy Indications:           Dysphagia, Foreign body in the esophagus Providers:             Boykin Nearing. Katricia Prehn MD, MD Medicines:             General Anesthesia Complications:         No immediate complications. Estimated blood loss: None. Procedure:             Pre-Anesthesia Assessment:                        - The risks and benefits of the procedure and the                         sedation options and risks were discussed with the                         patient. All questions were answered and informed                         consent was obtained.                        - Patient identification and proposed procedure were                         verified prior to the procedure by the nurse. The                         procedure was verified in the procedure room.                        - ASA Grade Assessment: II - A patient with mild                         systemic disease.                        - After reviewing the risks and benefits, the patient                         was deemed in satisfactory condition to undergo the                         procedure.                        After obtaining informed consent, the endoscope was                         passed under direct vision. Throughout the procedure,                         the patient's blood pressure, pulse, and oxygen  saturations were monitored continuously. The Endoscope                         was introduced through the mouth, and advanced to the                         third part of duodenum. The patient tolerated the                         procedure well. The upper GI endoscopy was somewhat                          difficult due to presence of foreign body. Successful                         completion of the procedure was aided by performing                         the maneuvers documented (below) in this report. Findings:      Food was found in the lower third of the esophagus. The scope was       navigated around the meat bolus to the gastroesophageal junction where       there was noted a mild stricture. The tip of the scope traversed the       stricture and dilated it to some degree. Some moderate mucosal       disruption of the stricture was noted without bleeding or perforation       noted.      The scope was then withdrawn back into the proximal esophagus and the       bolus cleared with a combination of water irrigation and endoscopic       pushing of the entire bolus into the stomach.      Pictures taken show complete clearance of the food bolus through the       gastroesophageal junction.      Clear fluid was found in the gastric fundus.      The examined duodenum was normal.      The exam was otherwise without abnormality. Impression:            - Food was found in the esophagus. Removal was                         successful.                        - Clear gastric fluid.                        - Normal examined duodenum.                        - The examination was otherwise normal. Recommendation:        - Patient has a contact number available for                         emergencies. The signs and symptoms of potential                         delayed complications were  discussed with the patient.                         Return to normal activities tomorrow. Written                         discharge instructions were provided to the patient.                        - Full liquid diet for 2 days.                        - Advance diet as tolerated on the third day.                        - Use Prilosec OTC 20 mg PO daily.                        - Return to my  office in 6 weeks.                        - The findings and recommendations were discussed with                         the patient.                        - Discharge to home OK from a GI standpoint. Procedure Code(s):     --- Professional ---                        [phone], Esophagogastroduodenoscopy, flexible,                         transoral; with removal of foreign body(s) Diagnosis Code(s):     --- Professional ---                        T18.108A, Unspecified foreign body in esophagus                         causing other injury, initial encounter                        R13.10, Dysphagia, unspecified                        T18.128A, Food in esophagus causing other injury,                         initial encounter CPT copyright 2019 American Medical Association. All rights reserved. The codes documented in this report are preliminary and upon coder review may  be revised to meet current compliance requirements. Stanton Kidney MD, MD 02/28/2020 6:50:16 PM This report has been signed electronically. Number of Addenda: 0 Note Initiated On: 02/28/2020 6:03 PM Estimated Blood Loss:  Estimated blood loss: none.      [facility]

## 2020-02-28 NOTE — H&P (View-Only) (Signed)
GI Inpatient Consult Note  Reason for Consult: Esophageal obstruction 2/2 food impaction   Attending Requesting Consult: Dr. Larinda Buttery  History of Present Illness: Vincent Huffman is a 44 y.o. male seen for evaluation of esophageal food obstruction 2/2 food impaction at the request of Dr. Larinda Buttery. Pt has a PMH of chronic reflux esophagitis, DJD lumbar spine, and eosinophilic esophagitis. He presented to the ED this afternoon for complaints of inability to tolerate any solid food intake and chest discomfort. He reports around 11 AM this morning he was eating steak when he felt like the steak got stuck at level of his upper sternum just under the suprasternal notch. He reports immediate chest tightness and pain associated with the dysphagia. He tried to drink some cola, but this was ineffective. Everything he has tried to intake, solid or liquid, has been immediately regurgitated. He reports this happened previously appx 7 years ago in [place] where he required EGD for foreign body removal. He was seen in the ED in December last year for roast beef getting stuck in his throat which passed spontaneously. There have been previous episodes over past few years where glucagon has helped pass the food bolus. He reports he has been trying to vomit, but only some clear liquid is coming up. He was given glucagon in the ED, but this was unhelpful. He does not take any chronic acid suppression therapy. He has been hesitant to use PPIs in the past as he wants to try dietary therapy to prevent reflux. He does not he has been taking high dose NSAIDs over the past two weeks for back pain, as much as 1500-2000 mg a day. He denies any shortness of breath, inability to tolerate secretions, or abdominal pain. No fever, chills, rectal bleeding, or unintentional weight loss. COVID-19 test is pending at time of note.    Past Medical History:  Past Medical History:  Diagnosis Date  . Chronic reflux esophagitis   . Degenerative joint  disease (DJD) of lumbar spine   . Esophagitis   . GERD (gastroesophageal reflux disease)     Problem List: Patient Active Problem List   Diagnosis Date Noted  . Healthcare maintenance 12/16/2017    Past Surgical History: Past Surgical History:  Procedure Laterality Date  . ADENOIDECTOMY    . TONSILLECTOMY      Allergies: Allergies  Allergen Reactions  . Iodinated Diagnostic Agents Other (See Comments)    "MADE ME FEEL OUT OF CONTROL, SLEEPY, BURNING INSIDE"    Home Medications: (Not in a hospital admission)  Home medication reconciliation was completed with the patient.   Scheduled Inpatient Medications:    Continuous Inpatient Infusions:    PRN Inpatient Medications:    Family History: family history includes Diabetes in his maternal grandfather; Heart disease in his father and mother.  The patient's family history is negative for inflammatory bowel disorders, GI malignancy, or solid organ transplantation.  Social History:   reports that he has never smoked. He has never used smokeless tobacco. He reports that he does not drink alcohol and does not use drugs. The patient denies ETOH, tobacco, or drug use.   Review of Systems: Constitutional: Weight is stable.  Eyes: No changes in vision. ENT: No oral lesions, sore throat.  GI: see HPI.  Heme/Lymph: No easy bruising.  CV: No chest pain.  GU: No hematuria.  Integumentary: No rashes.  Neuro: No headaches.  Psych: No depression/anxiety.  Endocrine: No heat/cold intolerance.  Allergic/Immunologic: No urticaria.  Resp: No cough,  SOB.  Musculoskeletal: No joint swelling.    Physical Examination: BP 126/75 (BP Location: Left Arm)   Pulse 72   Temp 97.8 F (36.6 C) (Oral)   Resp 16   Ht 6' (1.829 m)   Wt 83.9 kg   SpO2 100%   BMI 25.09 kg/m  Gen: NAD, alert and oriented x 4 HEENT: PEERLA, EOMI, Neck: supple, no JVD or thyromegaly Chest: CTA bilaterally, no wheezes, crackles, or other adventitious  sounds CV: RRR, no m/g/c/r Abd: soft, NT, ND, +BS in all four quadrants; no HSM, guarding, ridigity, or rebound tenderness Ext: no edema, well perfused with 2+ pulses, Skin: no rash or lesions noted Lymph: no LAD  Data: Lab Results  Component Value Date   WBC 7.5 02/28/2020   HGB 14.3 02/28/2020   HCT 42.4 02/28/2020   MCV 91.4 02/28/2020   PLT 245 02/28/2020   Recent Labs  Lab 02/28/20 1603  HGB 14.3   Lab Results  Component Value Date   NA 142 02/28/2020   K 4.3 02/28/2020   CL 104 02/28/2020   CO2 30 02/28/2020   BUN 9 02/28/2020   CREATININE 0.86 02/28/2020   Lab Results  Component Value Date   ALT 26 12/28/2018   AST 24 12/28/2018   ALKPHOS 55 12/28/2018   BILITOT 0.8 12/28/2018   No results for input(s): APTT, INR, PTT in the last 168 hours. Assessment/Plan:  44 y/o Caucasian male with a PMH of eosinophilic esophagitis and chronic reflux esophagitis presented to the ED for chief complaint of esophageal food obstruction  1. Esophageal food obstruction 2/2 food impaction  2. Reported diagnosis of eosinophilic esophagitis  -Clinical presentation is highly consistent with esophageal food obstruction 2/2 food impaction from steak this morning. Glucagon was unhelpful. Patient in no acute distress, tolerating secretions, with normal vital signs. -Advise urgent EGD with foreign body removal this afternoon with Dr. Toledo. We discussed procedure details and indications in ED triage room today.  -Symptoms and history consistent with uncontrolled EoE. He will benefit from acid suppression therapy -See procedure note for findings and further recommendations   -I reviewed the risks (including bleeding, perforation, infection, anesthesia complications, cardiac/respiratory complications), benefits and alternatives of EGD. Patient consents to proceed.    Thank you for the consult. Please call with questions or concerns.  Kalya Troeger M Tuvia Woodrick, PA-C [facility]  Gastroenterology [phone] (Cell)  

## 2020-02-28 NOTE — ED Triage Notes (Signed)
Pt c/o having a piece of steak stuck in his throat for the past couple of hours, states this has happened in the past , pt is in NAD at present, airway is patent.

## 2020-02-28 NOTE — Consult Note (Signed)
GI Inpatient Consult Note  Reason for Consult: Esophageal obstruction 2/2 food impaction   Attending Requesting Consult: Dr. Larinda Buttery  History of Present Illness: Vincent Huffman is a 44 y.o. male seen for evaluation of esophageal food obstruction 2/2 food impaction at the request of Dr. Larinda Buttery. Pt has a PMH of chronic reflux esophagitis, DJD lumbar spine, and eosinophilic esophagitis. He presented to the ED this afternoon for complaints of inability to tolerate any solid food intake and chest discomfort. He reports around 11 AM this morning he was eating steak when he felt like the steak got stuck at level of his upper sternum just under the suprasternal notch. He reports immediate chest tightness and pain associated with the dysphagia. He tried to drink some cola, but this was ineffective. Everything he has tried to intake, solid or liquid, has been immediately regurgitated. He reports this happened previously appx 7 years ago in [place] where he required EGD for foreign body removal. He was seen in the ED in December last year for roast beef getting stuck in his throat which passed spontaneously. There have been previous episodes over past few years where glucagon has helped pass the food bolus. He reports he has been trying to vomit, but only some clear liquid is coming up. He was given glucagon in the ED, but this was unhelpful. He does not take any chronic acid suppression therapy. He has been hesitant to use PPIs in the past as he wants to try dietary therapy to prevent reflux. He does not he has been taking high dose NSAIDs over the past two weeks for back pain, as much as 1500-2000 mg a day. He denies any shortness of breath, inability to tolerate secretions, or abdominal pain. No fever, chills, rectal bleeding, or unintentional weight loss. COVID-19 test is pending at time of note.    Past Medical History:  Past Medical History:  Diagnosis Date  . Chronic reflux esophagitis   . Degenerative joint  disease (DJD) of lumbar spine   . Esophagitis   . GERD (gastroesophageal reflux disease)     Problem List: Patient Active Problem List   Diagnosis Date Noted  . Healthcare maintenance 12/16/2017    Past Surgical History: Past Surgical History:  Procedure Laterality Date  . ADENOIDECTOMY    . TONSILLECTOMY      Allergies: Allergies  Allergen Reactions  . Iodinated Diagnostic Agents Other (See Comments)    "MADE ME FEEL OUT OF CONTROL, SLEEPY, BURNING INSIDE"    Home Medications: (Not in a hospital admission)  Home medication reconciliation was completed with the patient.   Scheduled Inpatient Medications:    Continuous Inpatient Infusions:    PRN Inpatient Medications:    Family History: family history includes Diabetes in his maternal grandfather; Heart disease in his father and mother.  The patient's family history is negative for inflammatory bowel disorders, GI malignancy, or solid organ transplantation.  Social History:   reports that he has never smoked. He has never used smokeless tobacco. He reports that he does not drink alcohol and does not use drugs. The patient denies ETOH, tobacco, or drug use.   Review of Systems: Constitutional: Weight is stable.  Eyes: No changes in vision. ENT: No oral lesions, sore throat.  GI: see HPI.  Heme/Lymph: No easy bruising.  CV: No chest pain.  GU: No hematuria.  Integumentary: No rashes.  Neuro: No headaches.  Psych: No depression/anxiety.  Endocrine: No heat/cold intolerance.  Allergic/Immunologic: No urticaria.  Resp: No cough,  SOB.  Musculoskeletal: No joint swelling.    Physical Examination: BP 126/75 (BP Location: Left Arm)   Pulse 72   Temp 97.8 F (36.6 C) (Oral)   Resp 16   Ht 6' (1.829 m)   Wt 83.9 kg   SpO2 100%   BMI 25.09 kg/m  Gen: NAD, alert and oriented x 4 HEENT: PEERLA, EOMI, Neck: supple, no JVD or thyromegaly Chest: CTA bilaterally, no wheezes, crackles, or other adventitious  sounds CV: RRR, no m/g/c/r Abd: soft, NT, ND, +BS in all four quadrants; no HSM, guarding, ridigity, or rebound tenderness Ext: no edema, well perfused with 2+ pulses, Skin: no rash or lesions noted Lymph: no LAD  Data: Lab Results  Component Value Date   WBC 7.5 02/28/2020   HGB 14.3 02/28/2020   HCT 42.4 02/28/2020   MCV 91.4 02/28/2020   PLT 245 02/28/2020   Recent Labs  Lab 02/28/20 1603  HGB 14.3   Lab Results  Component Value Date   NA 142 02/28/2020   K 4.3 02/28/2020   CL 104 02/28/2020   CO2 30 02/28/2020   BUN 9 02/28/2020   CREATININE 0.86 02/28/2020   Lab Results  Component Value Date   ALT 26 12/28/2018   AST 24 12/28/2018   ALKPHOS 55 12/28/2018   BILITOT 0.8 12/28/2018   No results for input(s): APTT, INR, PTT in the last 168 hours. Assessment/Plan:  44 y/o Caucasian male with a PMH of eosinophilic esophagitis and chronic reflux esophagitis presented to the ED for chief complaint of esophageal food obstruction  1. Esophageal food obstruction 2/2 food impaction  2. Reported diagnosis of eosinophilic esophagitis  -Clinical presentation is highly consistent with esophageal food obstruction 2/2 food impaction from steak this morning. Glucagon was unhelpful. Patient in no acute distress, tolerating secretions, with normal vital signs. -Advise urgent EGD with foreign body removal this afternoon with Dr. Norma Fredrickson. We discussed procedure details and indications in ED triage room today.  -Symptoms and history consistent with uncontrolled EoE. He will benefit from acid suppression therapy -See procedure note for findings and further recommendations   -I reviewed the risks (including bleeding, perforation, infection, anesthesia complications, cardiac/respiratory complications), benefits and alternatives of EGD. Patient consents to proceed.    Thank you for the consult. Please call with questions or concerns.  Mickle Mallory [facility]  Gastroenterology [phone] (Cell)

## 2020-02-28 NOTE — Transfer of Care (Signed)
Immediate Anesthesia Transfer of Care Note  Patient: Vincent Huffman  Procedure(s) Performed: ESOPHAGOGASTRODUODENOSCOPY (EGD) (N/A )  Patient Location: PACU  Anesthesia Type:General  Level of Consciousness: drowsy and patient cooperative  Airway & Oxygen Therapy: Patient Spontanous Breathing and Patient connected to face mask oxygen  Post-op Assessment: Report given to RN and Post -op Vital signs reviewed and stable  Post vital signs: Reviewed and stable  Last Vitals:  Vitals Value Taken Time  BP 123/78 02/28/20 1857  Temp    Pulse 54 02/28/20 1857  Resp 19 02/28/20 1857  SpO2 100 % 02/28/20 1857  Vitals shown include unvalidated device data.  Last Pain:  Vitals:   02/28/20 1609  TempSrc: Oral  PainSc:          Complications: No complications documented.

## 2020-02-28 NOTE — ED Notes (Signed)
AAOx3.  Skin warm and dry.  Denies relief from glucagon, states, "I'm feeling worse".  Continues to c/o nausea.

## 2020-02-28 NOTE — Anesthesia Procedure Notes (Signed)
Procedure Name: Intubation Date/Time: 02/28/2020 6:20 PM Performed by: Elmarie Mainland, CRNA Pre-anesthesia Checklist: Patient identified, Emergency Drugs available, Suction available and Patient being monitored Patient Re-evaluated:Patient Re-evaluated prior to induction Oxygen Delivery Method: Circle system utilized Preoxygenation: Pre-oxygenation with 100% oxygen Induction Type: IV induction and Rapid sequence Laryngoscope Size: McGraph and 4 Grade View: Grade I Tube type: Oral Tube size: 7.5 mm Number of attempts: 1 Airway Equipment and Method: Stylet,  Oral airway and Video-laryngoscopy Placement Confirmation: ETT inserted through vocal cords under direct vision,  positive ETCO2 and breath sounds checked- equal and bilateral Secured at: 23 cm Tube secured with: Tape Dental Injury: Teeth and Oropharynx as per pre-operative assessment  Comments: Oropharynx clear.

## 2020-02-28 NOTE — Anesthesia Postprocedure Evaluation (Signed)
Anesthesia Post Note  Patient: Renny Remer  Procedure(s) Performed: ESOPHAGOGASTRODUODENOSCOPY (EGD) (N/A )  Patient location during evaluation: PACU Anesthesia Type: General Level of consciousness: awake and alert Pain management: pain level controlled Vital Signs Assessment: post-procedure vital signs reviewed and stable Respiratory status: spontaneous breathing, nonlabored ventilation, respiratory function stable and patient connected to nasal cannula oxygen Cardiovascular status: blood pressure returned to baseline and stable Postop Assessment: no apparent nausea or vomiting Anesthetic complications: no   No complications documented.   Last Vitals:  Vitals:   02/28/20 1933 02/28/20 1941  BP: 111/77 124/78  Pulse: 61 49  Resp: 16 13  Temp:    SpO2: 100% 100%    Last Pain:  Vitals:   02/28/20 1941  TempSrc:   PainSc: 0-No pain                 Cleda Mccreedy Jullien Granquist

## 2020-02-28 NOTE — ED Provider Notes (Signed)
[facility] Emergency Department Provider Note   ____________________________________________   None    (approximate)  I have reviewed the triage vital signs and the nursing notes.   HISTORY  Chief Complaint Foreign Body    HPI Vincent Huffman is a 44 y.o. male with past medical history of GERD and esophagitis who presents to the ED complaining of difficulty swallowing.  Patient reports that he was eating steak around 1130 this morning when he felt like something got stuck in his chest.  He describes a discomfort behind his sternum and has been unable to swallow his spit since onset of symptoms.  He vomits up any liquids that he attempted to drink, describes symptoms as similar to when he previously had meat removed from his esophagus.  He denies any difficulty breathing at this time.  He did receive glucagon from triage without any significant relief.        Past Medical History:  Diagnosis Date  . Chronic reflux esophagitis   . Degenerative joint disease (DJD) of lumbar spine   . Esophagitis   . GERD (gastroesophageal reflux disease)     Patient Active Problem List   Diagnosis Date Noted  . Healthcare maintenance 12/16/2017    Past Surgical History:  Procedure Laterality Date  . ADENOIDECTOMY    . TONSILLECTOMY      Prior to Admission medications   Medication Sig Start Date End Date Taking? Authorizing Provider  baclofen (LIORESAL) 10 MG tablet Take 1 tablet (10 mg total) by mouth 3 (three) times daily as needed for muscle spasms. 02/19/20  Yes Harris, Abigail, PA-C  cyclobenzaprine (FLEXERIL) 10 MG tablet Take 1 tablet (10 mg total) by mouth 3 (three) times daily as needed for muscle spasms. 02/27/20   Eulis Foster, FNP  cyclobenzaprine (FLEXERIL) 5 MG tablet Take 1 tablet (5 mg total) by mouth 3 (three) times daily as needed for muscle spasms. 07/27/19   Johnna Acosta, NP  methylPREDNISolone (MEDROL DOSEPAK) 4 MG TBPK tablet Use as  directed 02/19/20   Arthor Captain, PA-C  naproxen (NAPROSYN) 500 MG tablet Take 1 tablet (500 mg total) by mouth 2 (two) times daily with a meal. 02/27/20   Worthy Rancher B, FNP  tizanidine (ZANAFLEX) 2 MG capsule Take 1 capsule (2 mg total) by mouth 2 (two) times daily as needed for muscle spasms. 07/27/19   Johnna Acosta, NP  ranitidine (ZANTAC) 150 MG capsule Take 1 capsule (150 mg total) by mouth 2 (two) times daily. 07/08/15 07/08/15  Sharman Cheek, MD    Allergies Iodinated diagnostic agents  Family History  Problem Relation Age of Onset  . Heart disease Mother   . Heart disease Father   . Diabetes Maternal Grandfather     Social History Social History   Tobacco Use  . Smoking status: Never Smoker  . Smokeless tobacco: Never Used  Vaping Use  . Vaping Use: Never used  Substance Use Topics  . Alcohol use: No  . Drug use: No    Review of Systems  Constitutional: No fever/chills Eyes: No visual changes. ENT: No sore throat. Cardiovascular: Denies chest pain. Respiratory: Denies shortness of breath. Gastrointestinal: No abdominal pain.  No nausea, no vomiting.  No diarrhea.  No constipation.  Positive for food impaction. Genitourinary: Negative for dysuria. Musculoskeletal: Negative for back pain. Skin: Negative for rash. Neurological: Negative for headaches, focal weakness or numbness.  ____________________________________________   PHYSICAL EXAM:  VITAL SIGNS: ED Triage Vitals  Enc  Vitals Group     BP 02/28/20 1349 (!) 137/78     Pulse Rate 02/28/20 1349 75     Resp 02/28/20 1349 18     Temp 02/28/20 1349 97.9 F (36.6 C)     Temp Source 02/28/20 1349 Oral     SpO2 02/28/20 1349 99 %     Weight 02/28/20 1350 185 lb (83.9 kg)     Height 02/28/20 1350 6' (1.829 m)     Head Circumference --      Peak Flow --      Pain Score 02/28/20 1350 6     Pain Loc --      Pain Edu? --      Excl. in GC? --     Constitutional: Alert and oriented. Eyes:  Conjunctivae are normal. Head: Atraumatic. Nose: No congestion/rhinnorhea. Mouth/Throat: Mucous membranes are moist.  Continuously spitting secretions. Neck: Normal ROM Cardiovascular: Normal rate, regular rhythm. Grossly normal heart sounds. Respiratory: Normal respiratory effort.  No retractions. Lungs CTAB. Gastrointestinal: Soft and nontender. No distention. Genitourinary: deferred Musculoskeletal: No lower extremity tenderness nor edema. Neurologic:  Normal speech and language. No gross focal neurologic deficits are appreciated. Skin:  Skin is warm, dry and intact. No rash noted. Psychiatric: Mood and affect are normal. Speech and behavior are normal.  ____________________________________________   LABS (all labs ordered are listed, but only abnormal results are displayed)  Labs Reviewed  SARS CORONAVIRUS 2 BY RT PCR (HOSPITAL ORDER, PERFORMED IN  HOSPITAL LAB)  BASIC METABOLIC PANEL  CBC WITH DIFFERENTIAL/PLATELET     PROCEDURES  Procedure(s) performed (including Critical Care):  Procedures   ____________________________________________   INITIAL IMPRESSION / ASSESSMENT AND PLAN / ED COURSE       45 year old male with past medical history of GERD and esophagitis presents to the ED complaining of feeling like food is stuck in his throat ever since he ate some steak around 1130 this morning.  He describes feeling an obstruction behind his sternum and there does not appear to be any airway involvement.  Chest x-ray is unremarkable.  Glucagon was not effective in alleviating his symptoms and case was discussed with Dr. Norma Fredrickson of GI, who will plan for endoscopy.      ____________________________________________   FINAL CLINICAL IMPRESSION(S) / ED DIAGNOSES  Final diagnoses:  Esophageal obstruction due to food impaction     ED Discharge Orders    None       Note:  This document was prepared using Dragon voice recognition software and may include  unintentional dictation errors.   Chesley Noon, MD 02/28/20 1758

## 2020-02-28 NOTE — Anesthesia Preprocedure Evaluation (Signed)
Anesthesia Evaluation  Patient identified by MRN, date of birth, ID band Patient awake    Reviewed: Allergy & Precautions, H&P , NPO status , Patient's Chart, lab work & pertinent test results  History of Anesthesia Complications Negative for: history of anesthetic complications  Airway Mallampati: III  TM Distance: >3 FB Neck ROM: full    Dental  (+) Chipped   Pulmonary neg pulmonary ROS, neg shortness of breath,    Pulmonary exam normal        Cardiovascular Exercise Tolerance: Good (-) angina(-) Past MI and (-) DOE negative cardio ROS Normal cardiovascular exam     Neuro/Psych negative neurological ROS  negative psych ROS   GI/Hepatic Neg liver ROS, GERD  Medicated and Controlled,  Endo/Other  negative endocrine ROS  Renal/GU      Musculoskeletal  (+) Arthritis ,   Abdominal   Peds  Hematology negative hematology ROS (+)   Anesthesia Other Findings Food Bolus  Past Medical History: No date: Chronic reflux esophagitis No date: Degenerative joint disease (DJD) of lumbar spine No date: Esophagitis No date: GERD (gastroesophageal reflux disease)  Past Surgical History: No date: ADENOIDECTOMY No date: TONSILLECTOMY  BMI    Body Mass Index: 25.09 kg/m      Reproductive/Obstetrics negative OB ROS                             Anesthesia Physical Anesthesia Plan  ASA: IV and emergent  Anesthesia Plan: General ETT, Rapid Sequence and Cricoid Pressure   Post-op Pain Management:    Induction: Intravenous  PONV Risk Score and Plan: Ondansetron, Dexamethasone, Midazolam and Treatment may vary due to age or medical condition  Airway Management Planned: Oral ETT  Additional Equipment:   Intra-op Plan:   Post-operative Plan: Extubation in OR  Informed Consent: I have reviewed the patients History and Physical, chart, labs and discussed the procedure including the risks,  benefits and alternatives for the proposed anesthesia with the patient or authorized representative who has indicated his/her understanding and acceptance.     Dental Advisory Given  Plan Discussed with: Anesthesiologist, CRNA and Surgeon  Anesthesia Plan Comments: (Patient consented for risks of anesthesia including but not limited to:  - adverse reactions to medications - damage to eyes, teeth, lips or other oral mucosa - nerve damage due to positioning  - sore throat or hoarseness - Damage to heart, brain, nerves, lungs, other parts of body or loss of life  Patient voiced understanding.)        Anesthesia Quick Evaluation

## 2020-02-28 NOTE — Interval H&P Note (Signed)
History and Physical Interval Note:  02/28/2020 6:07 PM  Vincent Huffman  has presented today for surgery, with the diagnosis of Foreign Body Esophagus.  The various methods of treatment have been discussed with the patient and family. After consideration of risks, benefits and other options for treatment, the patient has consented to  Procedure(s): ESOPHAGOGASTRODUODENOSCOPY (EGD) (N/A) as a surgical intervention.  The patient's history has been reviewed, patient examined, no change in status, stable for surgery.  I have reviewed the patient's chart and labs.  Questions were answered to the patient's satisfaction.     [place], [place]

## 2020-02-29 ENCOUNTER — Encounter: Payer: Self-pay | Admitting: Internal Medicine

## 2020-04-18 ENCOUNTER — Other Ambulatory Visit: Payer: Self-pay

## 2020-04-18 ENCOUNTER — Ambulatory Visit: Payer: BC Managed Care – PPO | Admitting: Internal Medicine

## 2020-04-18 DIAGNOSIS — M5442 Lumbago with sciatica, left side: Secondary | ICD-10-CM

## 2020-04-18 DIAGNOSIS — M5126 Other intervertebral disc displacement, lumbar region: Secondary | ICD-10-CM

## 2020-04-18 DIAGNOSIS — M5136 Other intervertebral disc degeneration, lumbar region: Secondary | ICD-10-CM

## 2020-04-18 DIAGNOSIS — M51369 Other intervertebral disc degeneration, lumbar region without mention of lumbar back pain or lower extremity pain: Secondary | ICD-10-CM

## 2020-04-18 DIAGNOSIS — G8929 Other chronic pain: Secondary | ICD-10-CM

## 2020-04-18 MED ORDER — METHYLPREDNISOLONE ACETATE 80 MG/ML IJ SUSP
80.0000 mg | Freq: Once | INTRAMUSCULAR | Status: AC
  Administered 2020-04-18: 80 mg via INTRAMUSCULAR

## 2020-04-18 MED ORDER — RABEPRAZOLE SODIUM 20 MG PO TBEC
20.0000 mg | DELAYED_RELEASE_TABLET | Freq: Every day | ORAL | 1 refills | Status: DC
Start: 2020-04-18 — End: 2020-07-22

## 2020-04-18 MED ORDER — PREDNISONE 10 MG PO TABS: ORAL_TABLET | ORAL | 0 refills | Status: DC

## 2020-04-18 MED ORDER — HYDROCODONE-ACETAMINOPHEN 10-325 MG PO TABS: 1.0000 | ORAL_TABLET | Freq: Three times a day (TID) | ORAL | 0 refills | Status: AC | PRN

## 2020-04-18 MED ORDER — CELECOXIB 200 MG PO CAPS
200.0000 mg | ORAL_CAPSULE | Freq: Every day | ORAL | 1 refills | Status: DC
Start: 2020-04-18 — End: 2020-04-25

## 2020-04-18 MED ORDER — CARISOPRODOL 350 MG PO TABS: ORAL_TABLET | ORAL | 0 refills | Status: DC

## 2020-04-18 NOTE — Progress Notes (Signed)
[facility] [address] [place], [place] [address]  Internal MEDICINE  Office Visit Note  Patient Name: Vincent Huffman  [id number]  [id number]  Date of Service: 04/25/2020  Chief Complaint  Patient presents with  . Acute Visit  . Back Pain    HPI Pt is c/o back pain, has chronic pain due to moderate disc bulge. Pt has been managing with traditional and non traditional therapy however for last one week or so he has missed work due to pain. Has difficulty walking as well.  Previous MRI done in 2015 Clinical history: Lumbar radicular symptoms and pain. (01/25/2014)  Technique: Imaging of the lumbar spine performed on a 1.5T MRI and correlated with x-rays dated 07/13/13   Findings:   For purposes of this discussion, image one of series 6 (the axial T2 series)defines the inferior endplate of L1. On the sagittal images vertebral body height and alignment are normal as is bone marrow signal. Disc desiccation and mild loss of intervertebral disc height at L5-S1. The conus medullaris terminates at a normal anatomic level.   No high-grade neural foraminal or central canal narrowing noted along the lower thoracic spine on sagittal images.   At L1-L2, no central canal or neuroforaminal narrowing.   At L2-L3, no central canal or neural foraminal narrowing.   At L3-L4, minimal disc bulge. No central canal or neural foraminal narrowing.   At L4-L5, minimal disc bulge extends the lateral recesses. Mild facet hypertrophy. No central canal or neural foraminal narrowing.  At L5-S1, moderate circumferential disc bulge extends into the lateral recesses with a small superimposed left foraminal protrusion. This causes moderate left neuroforaminal narrowing. This right neural foramen is patent. Central canal is patent.    Current Medication: Outpatient Encounter Medications as of 04/18/2020  Medication Sig  . [DISCONTINUED] methocarbamol (ROBAXIN) 750 MG tablet Take 750 mg by  mouth 4 (four) times daily. (Patient not taking: Reported on 04/25/2020)  . [DISCONTINUED] traMADol (ULTRAM) 50 MG tablet Take 50 mg by mouth 2 (two) times daily as needed. (Patient not taking: Reported on 04/25/2020)  . carisoprodol (SOMA) 350 MG tablet Take one tab po bid for muscle spasm  . [EXPIRED] HYDROcodone-acetaminophen (NORCO) 10-325 MG tablet Take 1 tablet by mouth every 8 (eight) hours as needed for up to 5 days.  . predniSONE (DELTASONE) 10 MG tablet Take one tab 3 x day for 3 days, then take one tab 2 x a day for 3 days and then take one tab a day for 3 days for copd  . RABEprazole (ACIPHEX) 20 MG tablet Take 1 tablet (20 mg total) by mouth daily.  . [DISCONTINUED] celecoxib (CELEBREX) 200 MG capsule Take 1 capsule (200 mg total) by mouth daily.  . [DISCONTINUED] ranitidine (ZANTAC) 150 MG capsule Take 1 capsule (150 mg total) by mouth 2 (two) times daily.  . [EXPIRED] methylPREDNISolone acetate (DEPO-MEDROL) injection 80 mg    No facility-administered encounter medications on file as of 04/18/2020.    Surgical History: Past Surgical History:  Procedure Laterality Date  . ADENOIDECTOMY    . ESOPHAGOGASTRODUODENOSCOPY N/A 02/28/2020   Procedure: ESOPHAGOGASTRODUODENOSCOPY (EGD);  Surgeon: Toledo, Boykin Nearing, MD;  Location: [facility] ENDOSCOPY;  Service: Gastroenterology;  Laterality: N/A;  . TONSILLECTOMY      Medical History: Past Medical History:  Diagnosis Date  . Chronic reflux esophagitis   . Degenerative joint disease (DJD) of lumbar spine   . Esophagitis   . GERD (gastroesophageal reflux disease)     Family History:  Family History  Problem Relation Age of Onset  . Heart disease Mother   . Heart disease Father   . Diabetes Maternal Grandfather     Social History   Socioeconomic History  . Marital status: Single    Spouse name: Not on file  . Number of children: Not on file  . Years of education: Not on file  . Highest education level: Not on file  Occupational  History  . Occupation: Working Personnel officer   Tobacco Use  . Smoking status: Never Smoker  . Smokeless tobacco: Never Used  Vaping Use  . Vaping Use: Never used  Substance and Sexual Activity  . Alcohol use: No  . Drug use: No  . Sexual activity: Not on file  Other Topics Concern  . Not on file  Social History Narrative  . Not on file   Social Determinants of Health   Financial Resource Strain:   . Difficulty of Paying Living Expenses: Not on file  Food Insecurity:   . Worried About Programme researcher, broadcasting/film/video in the Last Year: Not on file  . Ran Out of Food in the Last Year: Not on file  Transportation Needs:   . Lack of Transportation (Medical): Not on file  . Lack of Transportation (Non-Medical): Not on file  Physical Activity:   . Days of Exercise per Week: Not on file  . Minutes of Exercise per Session: Not on file  Stress:   . Feeling of Stress : Not on file  Social Connections:   . Frequency of Communication with Friends and Family: Not on file  . Frequency of Social Gatherings with Friends and Family: Not on file  . Attends Religious Services: Not on file  . Active Member of Clubs or Organizations: Not on file  . Attends Banker Meetings: Not on file  . Marital Status: Not on file  Intimate Partner Violence:   . Fear of Current or Ex-Partner: Not on file  . Emotionally Abused: Not on file  . Physically Abused: Not on file  . Sexually Abused: Not on file      Review of Systems  Constitutional: Positive for activity change.  Respiratory: Negative.  Negative for choking.   Cardiovascular: Negative for palpitations and leg swelling.  Endocrine: Negative.   Musculoskeletal: Positive for arthralgias and back pain.  Neurological: Positive for numbness. Negative for weakness.  Psychiatric/Behavioral: Negative.     Vital Signs: BP 124/74   Pulse 97   Temp 97.7 F (36.5 C)   Resp 16   Ht 6' (1.829 m)   Wt 181 lb 9.6 oz (82.4 kg)   SpO2 97%   BMI  24.63 kg/m    Physical Exam Constitutional:      Appearance: Normal appearance.  HENT:     Head: Normocephalic and atraumatic.  Eyes:     Extraocular Movements: Extraocular movements intact.     Pupils: Pupils are equal, round, and reactive to light.  Cardiovascular:     Rate and Rhythm: Normal rate and regular rhythm.  Pulmonary:     Effort: Pulmonary effort is normal.     Breath sounds: Normal breath sounds.  Musculoskeletal:     Comments: Pt is sitting in chair with discomfort, trying to hold is posture upright. Looks uncomfortable     Assessment/Plan: 1. Chronic bilateral low back pain with left-sided sciatica Start cleberex 200 mg po qd ..  - carisoprodol (SOMA) 350 MG tablet; Take one tab po bid for muscle spasm  Dispense: 30 tablet; Refill: 0 - predniSONE (DELTASONE) 10 MG tablet; Take one tab 3 x day for 3 days, then take one tab 2 x a day for 3 days and then take one tab a day for 3 days for copd  Dispense: 18 tablet; Refill: 0  2. Bulge of lumbar disc without myelopathy Pt will need update MRI and referral to [place] [facility], will need few days off from work   General Counseling: [name] verbalizes understanding of the findings of todays visit and agrees with plan of treatment. I have discussed any further diagnostic evaluation that may be needed or ordered today. We also reviewed his medications today. he has been encouraged to call the office with any questions or concerns that should arise related to todays visit.  Meds ordered this encounter  Medications  . carisoprodol (SOMA) 350 MG tablet    Sig: Take one tab po bid for muscle spasm    Dispense:  30 tablet    Refill:  0  . DISCONTD: celecoxib (CELEBREX) 200 MG capsule    Sig: Take 1 capsule (200 mg total) by mouth daily.    Dispense:  30 capsule    Refill:  1  . HYDROcodone-acetaminophen (NORCO) 10-325 MG tablet    Sig: Take 1 tablet by mouth every 8 (eight) hours as needed for up to 5 days.    Dispense:   15 tablet    Refill:  0  . RABEprazole (ACIPHEX) 20 MG tablet    Sig: Take 1 tablet (20 mg total) by mouth daily.    Dispense:  30 tablet    Refill:  1  . predniSONE (DELTASONE) 10 MG tablet    Sig: Take one tab 3 x day for 3 days, then take one tab 2 x a day for 3 days and then take one tab a day for 3 days for copd    Dispense:  18 tablet    Refill:  0  . methylPREDNISolone acetate (DEPO-MEDROL) injection 80 mg    Total time spent:25Minutes Time spent includes review of chart, medications, test results, and follow up plan with the patient.    Dr Lyndon Code Internal medicine

## 2020-04-23 ENCOUNTER — Telehealth: Payer: Self-pay

## 2020-04-23 NOTE — Telephone Encounter (Signed)
Pt was approved for coverage for HYDROCODONE ACETAMINOPHEN 10-325 mg as long as he remains covered by the state.  Approval from 04/18/2020 to 10/16/2020.  Per [facility].  dbs

## 2020-04-23 NOTE — Telephone Encounter (Signed)
Lmom to confirm and screen for 04-25-20 ov. 

## 2020-04-25 ENCOUNTER — Other Ambulatory Visit: Payer: Self-pay

## 2020-04-25 ENCOUNTER — Encounter: Payer: Self-pay | Admitting: Hospice and Palliative Medicine

## 2020-04-25 ENCOUNTER — Ambulatory Visit: Payer: BC Managed Care – PPO | Admitting: Hospice and Palliative Medicine

## 2020-04-25 DIAGNOSIS — M5441 Lumbago with sciatica, right side: Secondary | ICD-10-CM

## 2020-04-25 DIAGNOSIS — M5126 Other intervertebral disc displacement, lumbar region: Secondary | ICD-10-CM

## 2020-04-25 DIAGNOSIS — G8929 Other chronic pain: Secondary | ICD-10-CM

## 2020-04-25 DIAGNOSIS — M5442 Lumbago with sciatica, left side: Secondary | ICD-10-CM | POA: Diagnosis not present

## 2020-04-25 DIAGNOSIS — M5136 Other intervertebral disc degeneration, lumbar region: Secondary | ICD-10-CM

## 2020-04-25 MED ORDER — AMITRIPTYLINE HCL 10 MG PO TABS: 10.0000 mg | ORAL_TABLET | Freq: Every day | ORAL | 1 refills | Status: DC

## 2020-04-25 MED ORDER — METHYLPREDNISOLONE ACETATE 80 MG/ML IJ SUSP
40.0000 mg | Freq: Once | INTRAMUSCULAR | Status: AC
  Administered 2020-04-25: 40 mg via INTRAMUSCULAR

## 2020-04-25 MED ORDER — CELECOXIB 100 MG PO CAPS: 100.0000 mg | ORAL_CAPSULE | Freq: Two times a day (BID) | ORAL | 1 refills | Status: DC

## 2020-04-25 NOTE — Progress Notes (Signed)
[facility] [address] [place], [place] [address]  Internal MEDICINE  Office Visit Note  Patient Name: Vincent Huffman  [id number]  [id number]  Date of Service: 04/29/2020  Chief Complaint  Patient presents with  . Follow-up    lower back pain  . Quality Metric Gaps    HepC    HPI Patient is here for routine follow-up He is not interested in having CPE as he normally does not seek care from a healthcare provider He is here today to follow-up on lower back pain, he was last seen on 9/16 for same complaint At that visit he was started on Soma 350 mg, Celebrex 200 mg as well as prednisone taper In the past he has been able to manage his back pain with non-traditional therapies, he has been dealing with this back pain since 2015 Since his last visit, he continues to have pain, has not noticed much improvement with therapies prescribed at that time, he was unable to take anymore time off from work, continues to have pain while sitting down and lying down   Previous MRI done in 2015 Clinical history: Lumbar radicular symptoms and pain. (01/25/2014)  Technique: Imaging of the lumbar spine performed on a 1.5T MRI and correlated with x-rays dated 07/13/13   Findings:   For purposes of this discussion, image one of series 6 (the axial T2 series)defines the inferior endplate of L1. On the sagittal images vertebral body height and alignment are normal as is bone marrow signal. Disc desiccation and mild loss of intervertebral disc height at L5-S1. The conus medullaris terminates at a normal anatomic level.   No high-grade neural foraminal or central canal narrowing noted along the lower thoracic spine on sagittal images.   At L1-L2, no central canal or neuroforaminal narrowing.   At L2-L3, no central canal or neural foraminal narrowing.   At L3-L4, minimal disc bulge. No central canal or neural foraminal narrowing.   At L4-L5, minimal disc bulge extends the lateral  recesses. Mild facet hypertrophy. No central canal or neural foraminal narrowing.  At L5-S1, moderate circumferential disc bulge extends into the lateral recesses with a small superimposed left foraminal protrusion. This causes moderate left neuroforaminal narrowing. This right neural foramen is patent. Central canal is patent.    Current Medication: Outpatient Encounter Medications as of 04/25/2020  Medication Sig  . carisoprodol (SOMA) 350 MG tablet Take one tab po bid for muscle spasm  . predniSONE (DELTASONE) 10 MG tablet Take one tab 3 x day for 3 days, then take one tab 2 x a day for 3 days and then take one tab a day for 3 days for copd  . RABEprazole (ACIPHEX) 20 MG tablet Take 1 tablet (20 mg total) by mouth daily.  . [DISCONTINUED] celecoxib (CELEBREX) 200 MG capsule Take 1 capsule (200 mg total) by mouth daily.  Marland Kitchen amitriptyline (ELAVIL) 10 MG tablet Take 1 tablet (10 mg total) by mouth at bedtime.  . celecoxib (CELEBREX) 100 MG capsule Take 1 capsule (100 mg total) by mouth 2 (two) times daily.  . [DISCONTINUED] methocarbamol (ROBAXIN) 750 MG tablet Take 750 mg by mouth 4 (four) times daily. (Patient not taking: Reported on 04/25/2020)  . [DISCONTINUED] ranitidine (ZANTAC) 150 MG capsule Take 1 capsule (150 mg total) by mouth 2 (two) times daily.  . [DISCONTINUED] traMADol (ULTRAM) 50 MG tablet Take 50 mg by mouth 2 (two) times daily as needed. (Patient not taking: Reported on 04/25/2020)  . [EXPIRED] methylPREDNISolone acetate (  DEPO-MEDROL) injection 40 mg    No facility-administered encounter medications on file as of 04/25/2020.    Surgical History: Past Surgical History:  Procedure Laterality Date  . ADENOIDECTOMY    . ESOPHAGOGASTRODUODENOSCOPY N/A 02/28/2020   Procedure: ESOPHAGOGASTRODUODENOSCOPY (EGD);  Surgeon: Toledo, Boykin Nearing, MD;  Location: [facility] ENDOSCOPY;  Service: Gastroenterology;  Laterality: N/A;  . TONSILLECTOMY      Medical History: Past Medical History:   Diagnosis Date  . Chronic reflux esophagitis   . Degenerative joint disease (DJD) of lumbar spine   . Esophagitis   . GERD (gastroesophageal reflux disease)     Family History: Family History  Problem Relation Age of Onset  . Heart disease Mother   . Heart disease Father   . Diabetes Maternal Grandfather     Social History   Socioeconomic History  . Marital status: Single    Spouse name: Not on file  . Number of children: Not on file  . Years of education: Not on file  . Highest education level: Not on file  Occupational History  . Occupation: Working Personnel officer   Tobacco Use  . Smoking status: Never Smoker  . Smokeless tobacco: Never Used  Vaping Use  . Vaping Use: Never used  Substance and Sexual Activity  . Alcohol use: No  . Drug use: No  . Sexual activity: Not on file  Other Topics Concern  . Not on file  Social History Narrative  . Not on file   Social Determinants of Health   Financial Resource Strain:   . Difficulty of Paying Living Expenses: Not on file  Food Insecurity:   . Worried About Programme researcher, broadcasting/film/video in the Last Year: Not on file  . Ran Out of Food in the Last Year: Not on file  Transportation Needs:   . Lack of Transportation (Medical): Not on file  . Lack of Transportation (Non-Medical): Not on file  Physical Activity:   . Days of Exercise per Week: Not on file  . Minutes of Exercise per Session: Not on file  Stress:   . Feeling of Stress : Not on file  Social Connections:   . Frequency of Communication with Friends and Family: Not on file  . Frequency of Social Gatherings with Friends and Family: Not on file  . Attends Religious Services: Not on file  . Active Member of Clubs or Organizations: Not on file  . Attends Banker Meetings: Not on file  . Marital Status: Not on file  Intimate Partner Violence:   . Fear of Current or Ex-Partner: Not on file  . Emotionally Abused: Not on file  . Physically Abused: Not on file   . Sexually Abused: Not on file   Review of Systems  Constitutional: Negative for chills, fatigue and unexpected weight change.  HENT: Negative for congestion, postnasal drip, rhinorrhea, sneezing and sore throat.   Eyes: Negative for redness.  Respiratory: Negative for cough, chest tightness and shortness of breath.   Cardiovascular: Negative for chest pain, palpitations and leg swelling.  Gastrointestinal: Negative for abdominal pain, constipation, diarrhea, nausea and vomiting.  Genitourinary: Negative for dysuria and frequency.  Musculoskeletal: Positive for back pain and gait problem. Negative for arthralgias, joint swelling and neck pain.  Skin: Negative for rash.  Neurological: Negative for dizziness, tremors, numbness and headaches.  Hematological: Negative for adenopathy. Does not bruise/bleed easily.  Psychiatric/Behavioral: Negative for behavioral problems (Depression), sleep disturbance and suicidal ideas. The patient is not nervous/anxious.  Vital Signs: BP 112/71   Pulse 73   Temp 98 F (36.7 C)   Resp 16   Ht 6' (1.829 m)   Wt 185 lb 9.6 oz (84.2 kg)   SpO2 94%   BMI 25.17 kg/m    Physical Exam Constitutional:      Appearance: He is normal weight.  HENT:     Mouth/Throat:     Mouth: Mucous membranes are moist.     Pharynx: Oropharynx is clear.  Cardiovascular:     Rate and Rhythm: Normal rate and regular rhythm.     Pulses: Normal pulses.     Heart sounds: Normal heart sounds.  Pulmonary:     Effort: Pulmonary effort is normal.     Breath sounds: Normal breath sounds.  Abdominal:     General: Abdomen is flat.     Palpations: Abdomen is soft.  Musculoskeletal:     Cervical back: Normal range of motion.     Comments: Visible discomfort sitting in chair, has to stand and walk around exam room to help with pain  Skin:    General: Skin is warm.  Neurological:     General: No focal deficit present.     Mental Status: He is alert and oriented to  person, place, and time. Mental status is at baseline.  Psychiatric:        Mood and Affect: Mood normal.        Behavior: Behavior normal.        Thought Content: Thought content normal.    Assessment/Plan: 1. Chronic midline low back pain with bilateral sciatica Refill of Celebrex, will add amitriptyline for back pain and sciatica pain, advised to see how he responds to his medication over the weekend, may not be able to take during the week due to having to work and may cause drowsiness. Per his request would like to assess his vitamin D levels Dose of depo given today to assist with pain management - amitriptyline (ELAVIL) 10 MG tablet; Take 1 tablet (10 mg total) by mouth at bedtime.  Dispense: 30 tablet; Refill: 1 - celecoxib (CELEBREX) 100 MG capsule; Take 1 capsule (100 mg total) by mouth 2 (two) times daily.  Dispense: 30 capsule; Refill: 1 - Vitamin D 1,25 dihydroxy - methylPREDNISolone acetate (DEPO-MEDROL) injection 40 mg  2. Bulge of lumbar disc without myelopathy Will refer to ortho at this time, will obtain updated imaging, will need MRI imaging - Ambulatory referral to Orthopedic Surgery - DG Cervical Spine Complete; Future - DG Lumbar Spine Complete; Future  General Counseling: Martise verbalizes understanding of the findings of todays visit and agrees with plan of treatment. I have discussed any further diagnostic evaluation that may be needed or ordered today. We also reviewed his medications today. he has been encouraged to call the office with any questions or concerns that should arise related to todays visit.    Orders Placed This Encounter  Procedures  . DG Cervical Spine Complete  . DG Lumbar Spine Complete  . Vitamin D 1,25 dihydroxy  . Ambulatory referral to Orthopedic Surgery    Meds ordered this encounter  Medications  . amitriptyline (ELAVIL) 10 MG tablet    Sig: Take 1 tablet (10 mg total) by mouth at bedtime.    Dispense:  30 tablet    Refill:   1  . celecoxib (CELEBREX) 100 MG capsule    Sig: Take 1 capsule (100 mg total) by mouth 2 (two) times daily.  Dispense:  30 capsule    Refill:  1  . methylPREDNISolone acetate (DEPO-MEDROL) injection 40 mg    Time spent: 35 Minutes Time spent includes review of chart, medications, test results and follow-up plan with the patient.  This patient was seen by Leeanne Deedaylor Shasta Chinn AGNP-C in Collaboration with Dr Lyndon CodeFozia M Khan as a part of collaborative care agreement     Lubertha Basqueaylor S. Gad Aymond AGNP-C Internal medicine

## 2020-04-29 ENCOUNTER — Encounter: Payer: Self-pay | Admitting: Hospice and Palliative Medicine

## 2020-05-22 ENCOUNTER — Ambulatory Visit: Payer: BC Managed Care – PPO | Admitting: Hospice and Palliative Medicine

## 2020-06-25 ENCOUNTER — Telehealth: Payer: Self-pay | Admitting: Hospice and Palliative Medicine

## 2020-06-26 NOTE — Telephone Encounter (Signed)
What does that mean?

## 2020-07-11 ENCOUNTER — Ambulatory Visit: Payer: BC Managed Care – PPO | Admitting: Hospice and Palliative Medicine

## 2020-07-22 ENCOUNTER — Other Ambulatory Visit: Payer: Self-pay

## 2020-07-22 ENCOUNTER — Encounter: Payer: Self-pay | Admitting: Hospice and Palliative Medicine

## 2020-07-22 ENCOUNTER — Ambulatory Visit: Payer: BC Managed Care – PPO | Admitting: Hospice and Palliative Medicine

## 2020-07-22 VITALS — BP 131/76 | HR 63 | Temp 98.1°F | Resp 16 | Ht 72.0 in | Wt 182.2 lb

## 2020-07-22 DIAGNOSIS — M5136 Other intervertebral disc degeneration, lumbar region: Secondary | ICD-10-CM

## 2020-07-22 DIAGNOSIS — E785 Hyperlipidemia, unspecified: Secondary | ICD-10-CM | POA: Diagnosis not present

## 2020-07-22 DIAGNOSIS — Z125 Encounter for screening for malignant neoplasm of prostate: Secondary | ICD-10-CM

## 2020-07-22 DIAGNOSIS — R5383 Other fatigue: Secondary | ICD-10-CM | POA: Diagnosis not present

## 2020-07-22 DIAGNOSIS — M5126 Other intervertebral disc displacement, lumbar region: Secondary | ICD-10-CM | POA: Diagnosis not present

## 2020-07-22 NOTE — Progress Notes (Signed)
[facility] [address] [place], [place] [address]  Internal MEDICINE  Office Visit Note  Patient Name: Vincent Huffman  [id number]  [id number]  Date of Service: 07/23/2020  No chief complaint on file.   HPI Patient is here for routine follow-up His back pain has improved since his last visit Again, he was hesitant to come to appointment today as he is not fond of seeking care from health care professionals He prefers a more natural approach to healthcare, he supplements with many various natural substances such as vitamins, minerals and essential oils  He is no longer taking any prescription medications at this time for his back pain At last visit, encouraged to take some time for himself as he was overwhelmed due to the high demands of his job He has started back into meditating to help relieve his stress, looking 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 [DISCONTINUED] amitriptyline (ELAVIL) 10 MG tablet Take 1 tablet (10 mg total) by mouth at bedtime. (Patient not taking: Reported on 07/22/2020)  . [DISCONTINUED] carisoprodol (SOMA) 350 MG tablet Take one tab po bid for muscle spasm (Patient not taking: Reported on 07/22/2020)  . [DISCONTINUED] celecoxib (CELEBREX) 100 MG capsule Take 1 capsule (100 mg total) by mouth 2 (two) times daily. (Patient not taking: Reported on 07/22/2020)  . [DISCONTINUED] predniSONE  (DELTASONE) 10 MG tablet Take one tab 3 x day for 3 days, then take one tab 2 x a day for 3 days and then take one tab a day for 3 days for copd (Patient not taking: Reported on 07/22/2020)  . [DISCONTINUED] RABEprazole (ACIPHEX) 20 MG tablet Take 1 tablet (20 mg total) by mouth daily. (Patient not taking: Reported on 07/22/2020)  . [DISCONTINUED] ranitidine (ZANTAC) 150 MG capsule Take 1 capsule (150 mg total) by mouth 2 (two) times daily.   No facility-administered encounter medications on file as of 07/22/2020.    Surgical History: Past Surgical History:  Procedure Laterality Date  . ADENOIDECTOMY    . ESOPHAGOGASTRODUODENOSCOPY N/A 02/28/2020   Procedure: ESOPHAGOGASTRODUODENOSCOPY (EGD);  Surgeon: Toledo, Boykin Nearing, MD;  Location: [facility] ENDOSCOPY;  Service: Gastroenterology;  Laterality: N/A;  . TONSILLECTOMY      Medical History: Past Medical History:  Diagnosis Date  . Chronic reflux esophagitis   . Degenerative joint disease (DJD) of lumbar spine   . Esophagitis   . GERD (gastroesophageal reflux disease)     Family History: Family History  Problem Relation Age of Onset  . Heart disease Mother   . Heart disease Father   . Diabetes Maternal Grandfather     Social History   Socioeconomic History  . Marital status: Single    Spouse name: Not on file  . Number of children: Not on file  . Years of education: Not on file  . Highest education level: Not on file  Occupational History  . Occupation: Working Personnel officer  Tobacco Use  . Smoking status: Never Smoker  . Smokeless tobacco: Never Used  Vaping Use  . Vaping Use: Never used  Substance and Sexual Activity  . Alcohol use: No  . Drug use: No  . Sexual activity: Not on file  Other Topics Concern  . Not on file  Social History Narrative  . Not on file   Social Determinants of Health   Financial Resource Strain: Not on file  Food Insecurity: Not on file  Transportation Needs: Not on file  Physical  Activity: Not on file  Stress: Not on file  Social Connections: Not on file  Intimate Partner Violence: Not on file      Review of Systems  Constitutional: Negative for chills, fatigue and unexpected weight change.  HENT: Negative for congestion, postnasal drip, rhinorrhea, sneezing and sore throat.   Eyes: Negative for photophobia, redness and visual disturbance.  Respiratory: Negative for cough, chest tightness and shortness of breath.   Cardiovascular: Negative for chest pain, palpitations and leg swelling.  Gastrointestinal: Negative for abdominal pain, constipation, diarrhea, nausea and vomiting.  Genitourinary: Negative for dysuria and frequency.  Musculoskeletal: Negative for arthralgias, back pain, joint swelling and neck pain.  Skin: Negative for rash.  Neurological: Negative for tremors and numbness.  Hematological: Negative for adenopathy. Does not bruise/bleed easily.  Psychiatric/Behavioral: Negative for behavioral problems (Depression), sleep disturbance and suicidal ideas. The patient is not nervous/anxious.     Vital Signs: BP 131/76   Pulse 63   Temp 98.1 F (36.7 C)   Resp 16   Ht 6' (1.829 m)   Wt 182 lb 3.2 oz (82.6 kg)   SpO2 99%   BMI 24.71 kg/m    Physical Exam Vitals reviewed.  Constitutional:      Appearance: Normal appearance. He is normal weight.  Cardiovascular:     Rate and Rhythm: Normal rate and regular rhythm.     Pulses: Normal pulses.     Heart sounds: Normal heart sounds.  Pulmonary:     Effort: Pulmonary effort is normal.     Breath sounds: Normal breath sounds.  Abdominal:     General: Abdomen is flat.     Palpations: Abdomen is soft.  Musculoskeletal:        General: Normal range of motion.     Cervical back: Normal range of motion.  Skin:    General: Skin is warm.  Neurological:     General: No focal deficit present.     Mental Status: He is alert and oriented to person, place, and time. Mental status is at baseline.      Assessment/Plan: 1. Hyperlipidemia, unspecified hyperlipidemia type Slightly abnormal lipid levels on previous labs--will update levels and adjust therapy as indicated - Lipid Panel With LDL/HDL Ratio  2. Screening for prostate cancer - PSA  3. Fatigue, unspecified type Assess male hormone levels and adjust plan of care as indicated - Testosterone,Free and Total - Prolactin  4. Bulge of lumbar disc without myelopathy Pain in back has significantly improved, continues to use natural remedies to help alleviate discomfort at times  General Counseling: Vincent Huffman verbalizes understanding of the findings of todays visit and agrees with plan of treatment. I have discussed any further diagnostic evaluation that may be needed or ordered today. We also reviewed his medications today. he has been encouraged to call the office with any questions or concerns that should arise related to todays visit.    Orders Placed This Encounter  Procedures  .  Lipid Panel With LDL/HDL Ratio  . Testosterone,Free and Total  . Prolactin  . PSA    Time spent: 30 Minutes Time spent includes review of chart, medications, test results and follow-up plan with the patient.  This patient was seen by Leeanne Deed AGNP-C in Collaboration with Dr Lyndon Code as a part of collaborative care agreement     Lubertha Basque. Zimal Weisensel AGNP-C Internal medicine

## 2020-07-23 ENCOUNTER — Encounter: Payer: Self-pay | Admitting: Hospice and Palliative Medicine

## 2020-07-23 LAB — PROLACTIN: Prolactin: 4.6 ng/mL (ref 4.0–15.2)

## 2020-07-23 LAB — TESTOSTERONE,FREE AND TOTAL
Testosterone, Free: 16 pg/mL (ref 6.8–21.5)
Testosterone: 762 ng/dL (ref 264–916)

## 2020-07-23 LAB — LIPID PANEL WITH LDL/HDL RATIO
Cholesterol, Total: 196 mg/dL (ref 100–199)
HDL: 43 mg/dL (ref >39)
LDL Chol Calc (NIH): 130 mg/dL — ABNORMAL HIGH (ref 0–99)
LDL/HDL Ratio: 3 ratio (ref 0.0–3.6)
Triglycerides: 128 mg/dL (ref 0–149)
VLDL Cholesterol Cal: 23 mg/dL (ref 5–40)

## 2020-07-23 LAB — PSA: Prostate Specific Ag, Serum: 0.5 ng/mL (ref 0.0–4.0)

## 2020-08-16 ENCOUNTER — Other Ambulatory Visit: Payer: Self-pay | Admitting: Nurse Practitioner

## 2020-08-16 DIAGNOSIS — G8929 Other chronic pain: Secondary | ICD-10-CM

## 2020-08-16 DIAGNOSIS — M5416 Radiculopathy, lumbar region: Secondary | ICD-10-CM

## 2020-08-16 DIAGNOSIS — M542 Cervicalgia: Secondary | ICD-10-CM

## 2020-08-16 DIAGNOSIS — M545 Low back pain, unspecified: Secondary | ICD-10-CM

## 2020-08-20 ENCOUNTER — Telehealth: Payer: Self-pay

## 2020-08-20 NOTE — Telephone Encounter (Signed)
Ok, can we call back and request imaging reports? I cannot see them in the computer. Thanks!

## 2020-08-21 NOTE — Telephone Encounter (Signed)
[facility] is faxing over the report

## 2020-08-21 NOTE — Telephone Encounter (Signed)
We need to get him scheduled to see me in office so I can talk to him about this. Thanks for getting those reports for me.

## 2020-08-22 ENCOUNTER — Ambulatory Visit (INDEPENDENT_AMBULATORY_CARE_PROVIDER_SITE_OTHER): Payer: BC Managed Care – PPO | Admitting: Hospice and Palliative Medicine

## 2020-08-22 ENCOUNTER — Encounter: Payer: Self-pay | Admitting: Hospice and Palliative Medicine

## 2020-08-22 VITALS — Resp 16 | Ht 72.0 in | Wt 190.0 lb

## 2020-08-22 DIAGNOSIS — M5442 Lumbago with sciatica, left side: Secondary | ICD-10-CM | POA: Diagnosis not present

## 2020-08-22 DIAGNOSIS — M5441 Lumbago with sciatica, right side: Secondary | ICD-10-CM | POA: Diagnosis not present

## 2020-08-22 DIAGNOSIS — I517 Cardiomegaly: Secondary | ICD-10-CM | POA: Diagnosis not present

## 2020-08-22 DIAGNOSIS — G8929 Other chronic pain: Secondary | ICD-10-CM

## 2020-08-22 DIAGNOSIS — G479 Sleep disorder, unspecified: Secondary | ICD-10-CM | POA: Diagnosis not present

## 2020-08-22 NOTE — Progress Notes (Signed)
[facility] [address] [place], [place] [address]  Internal MEDICINE  Telephone Visit  Patient Name: Vincent Huffman  [id number]  [id number]  Date of Service: 08/28/2020  I connected with the patient at 1622 by telephone and verified the patients identity using two identifiers.   I discussed the limitations, risks, security and privacy concerns of performing an evaluation and management service by telephone and the availability of in person appointments. I also discussed with the patient that there may be a patient responsible charge related to the service.  The patient expressed understanding and agrees to proceed.    Chief Complaint  Patient presents with  . review test results  . Telephone Assessment    [phone]    HPI Patient is being seen virtually today for routine follow-up Recent imaging done by ortho for chronic back pain Scoliosis, degenerative disease as well as cardiomegaly At this time he is treating his pain without medication, he is using natural and homeopathic remedies for pain control Discussed cardiomegaly findings--denies symptoms of shortness of breath with exertion or ankle swelling He does struggle with insomnia--unsure if he snores Struggles with falling asleep as well as staying asleep  Current Medication: Outpatient Encounter Medications as of 08/22/2020  Medication Sig  . [DISCONTINUED] ranitidine (ZANTAC) 150 MG capsule Take 1 capsule (150 mg total) by mouth 2 (two) times daily.   No facility-administered encounter medications on file as of 08/22/2020.    Surgical History: Past Surgical History:  Procedure Laterality Date  . ADENOIDECTOMY    . ESOPHAGOGASTRODUODENOSCOPY N/A 02/28/2020   Procedure: ESOPHAGOGASTRODUODENOSCOPY (EGD);  Surgeon: Toledo, Boykin Nearing, MD;  Location: [facility] ENDOSCOPY;  Service: Gastroenterology;  Laterality: N/A;  . TONSILLECTOMY      Medical History: Past Medical History:  Diagnosis Date  . Chronic  reflux esophagitis   . Degenerative joint disease (DJD) of lumbar spine   . Esophagitis   . GERD (gastroesophageal reflux disease)     Family History: Family History  Problem Relation Age of Onset  . Heart disease Mother   . Heart disease Father   . Diabetes Maternal Grandfather     Social History   Socioeconomic History  . Marital status: Single    Spouse name: Not on file  . Number of children: Not on file  . Years of education: Not on file  . Highest education level: Not on file  Occupational History  . Occupation: Working Personnel officer   Tobacco Use  . Smoking status: Never Smoker  . Smokeless tobacco: Never Used  Vaping Use  . Vaping Use: Never used  Substance and Sexual Activity  . Alcohol use: No  . Drug use: No  . Sexual activity: Not on file  Other Topics Concern  . Not on file  Social History Narrative  . Not on file   Social Determinants of Health   Financial Resource Strain: Not on file  Food Insecurity: Not on file  Transportation Needs: Not on file  Physical Activity: Not on file  Stress: Not on file  Social Connections: Not on file  Intimate Partner Violence: Not on file      Review of Systems  Constitutional: Negative for chills, fatigue and unexpected weight change.  HENT: Negative for congestion, postnasal drip, rhinorrhea, sneezing and sore throat.   Eyes: Negative for redness.  Respiratory: Negative for cough, chest tightness and shortness of breath.   Cardiovascular: Negative for chest pain and palpitations.  Gastrointestinal: Negative for abdominal pain, constipation, diarrhea, nausea and vomiting.  Genitourinary: Negative for dysuria and frequency.  Musculoskeletal: Positive for back pain. Negative for arthralgias, joint swelling and neck pain.  Skin: Negative for rash.  Neurological: Negative.  Negative for tremors and numbness.  Hematological: Negative for adenopathy. Does not bruise/bleed easily.  Psychiatric/Behavioral: Positive  for sleep disturbance. Negative for behavioral problems (Depression) and suicidal ideas. The patient is not nervous/anxious.     Vital Signs: Resp 16   Ht 6' (1.829 m)   Wt 190 lb (86.2 kg)   BMI 25.77 kg/m    Observation/Objective: Alert and oriented, answers questions appropriately. No evidence of distress.  Assessment/Plan: 1. Cardiomegaly Review echocardiogram due to recent findings of cardiomegaly on imaging - ECHOCARDIOGRAM COMPLETE; Future  2. Chronic midline low back pain with bilateral sciatica Well controlled at this time--wanting to avoid medications and continue with treating his pain with natural rememdies  3. Sleep disturbance Consider PSG  General Counseling: [name] verbalizes understanding of the findings of today's phone visit and agrees with plan of treatment. I have discussed any further diagnostic evaluation that may be needed or ordered today. We also reviewed his medications today. he has been encouraged to call the office with any questions or concerns that should arise related to todays visit.    Orders Placed This Encounter  Procedures  . ECHOCARDIOGRAM COMPLETE    Time spent: 25 Minutes Time spent includes review of chart, medications, test results and follow-up plan with the patient.  Lubertha Basque Erian Rosengren AGNP-C Internal medicine

## 2020-08-22 NOTE — Telephone Encounter (Signed)
Spoke to pt and got him scheduled with taylor

## 2020-08-28 ENCOUNTER — Encounter: Payer: Self-pay | Admitting: Hospice and Palliative Medicine

## 2020-09-02 ENCOUNTER — Ambulatory Visit
Admission: RE | Admit: 2020-09-02 | Discharge: 2020-09-02 | Disposition: A | Discharge status: Home / Self Care | Payer: BC Managed Care – PPO | Source: Ambulatory Visit | Attending: Nurse Practitioner | Admitting: Nurse Practitioner

## 2020-09-02 ENCOUNTER — Other Ambulatory Visit: Payer: Self-pay

## 2020-09-02 DIAGNOSIS — M542 Cervicalgia: Secondary | ICD-10-CM | POA: Insufficient documentation

## 2020-09-02 DIAGNOSIS — M545 Low back pain, unspecified: Secondary | ICD-10-CM | POA: Insufficient documentation

## 2020-09-02 DIAGNOSIS — G8929 Other chronic pain: Secondary | ICD-10-CM | POA: Diagnosis present

## 2020-09-02 DIAGNOSIS — M5416 Radiculopathy, lumbar region: Secondary | ICD-10-CM | POA: Diagnosis present

## 2020-09-02 DIAGNOSIS — M546 Pain in thoracic spine: Secondary | ICD-10-CM | POA: Diagnosis present

## 2020-09-02 IMAGING — MR MR THORACIC SPINE W/O CM
6 series · 29 of 48 positions shown · non-contrast
Comparison: None.

CLINICAL DATA: Chronic neck pain.

Chronic midline thoracic back pain.
EXAM:
MRI CERVICAL AND THORACIC SPINE WITHOUT CONTRAST
TECHNIQUE: Multiplanar and multiecho pulse sequences of the cervical spine, to
include the craniocervical junction and cervicothoracic junction,
and the thoracic spine, were obtained without intravenous contrast.

[Series 16: T1 · sagittal · 5.0mm · 1.88mm/px · 2 of 9 slices shown (1 of 2)]
[im 1/9]
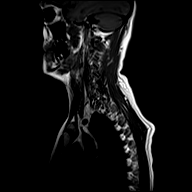
[im 9/9]
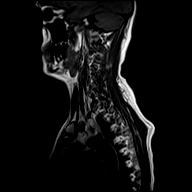

[Series 17: T2 · sagittal · 3.0mm · 1.06mm/px · 6 of 17 slices shown (1 of 2)]
[im 1/17]
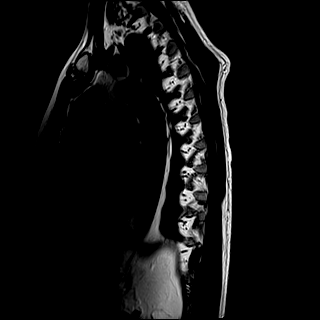
[im 4/17]
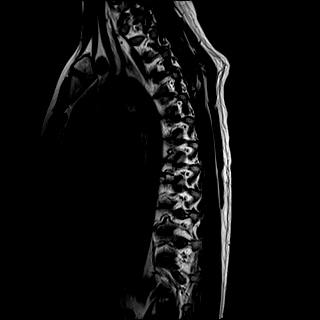
[im 7/17]
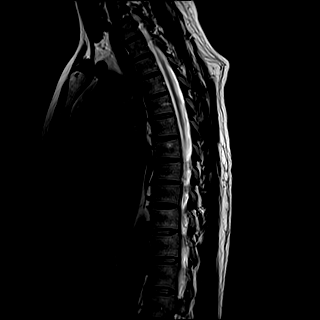
[im 10/17]
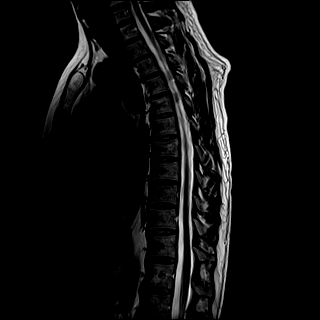
[im 13/17]
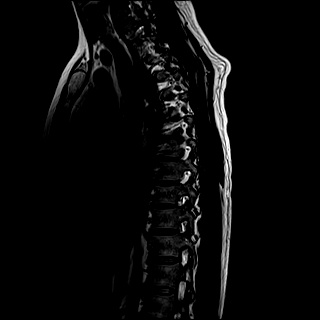
[im 17/17]
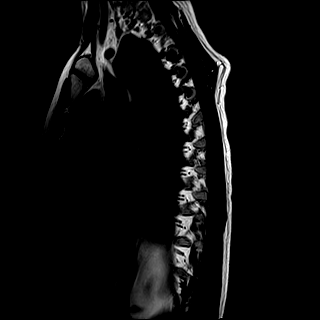

[Series 18: T1 · sagittal · 3.0mm · 1.06mm/px · 6 of 17 slices shown (2 of 2)]
[im 1/17]
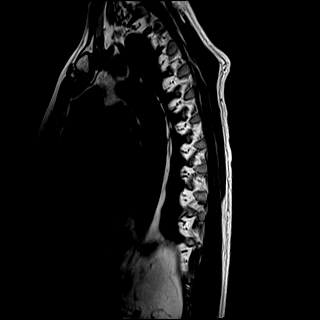
[im 4/17]
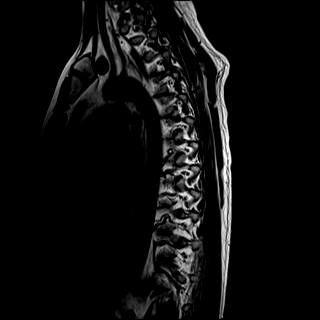
[im 7/17]
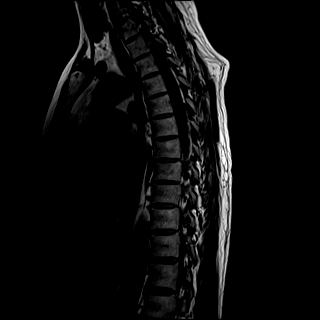
[im 10/17]
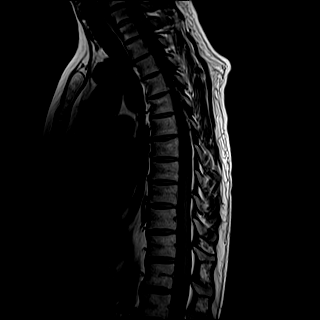
[im 13/17]
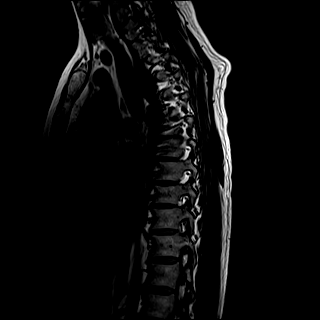
[im 17/17]
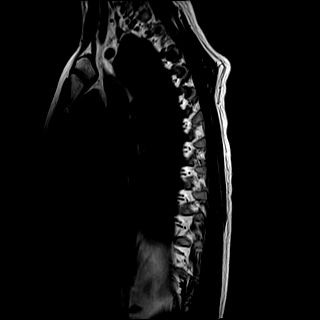

[Series 19: STIR · sagittal · 3.0mm · 0.53mm/px · 6 of 17 slices shown]
[im 1/17]
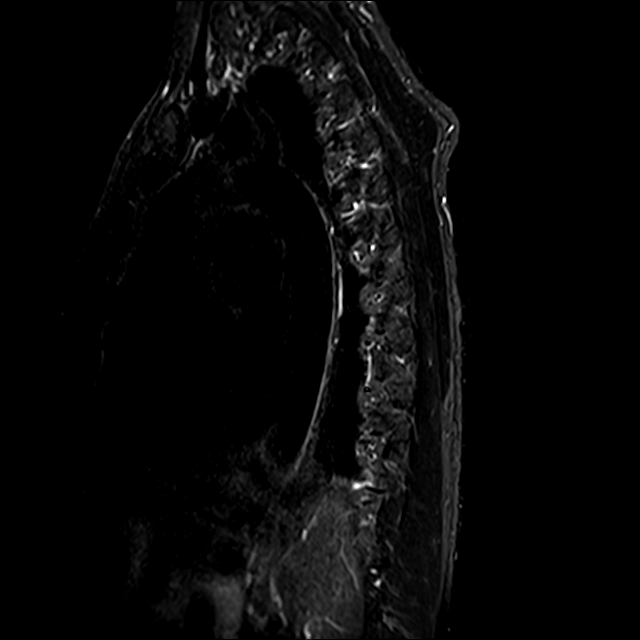
[im 4/17]
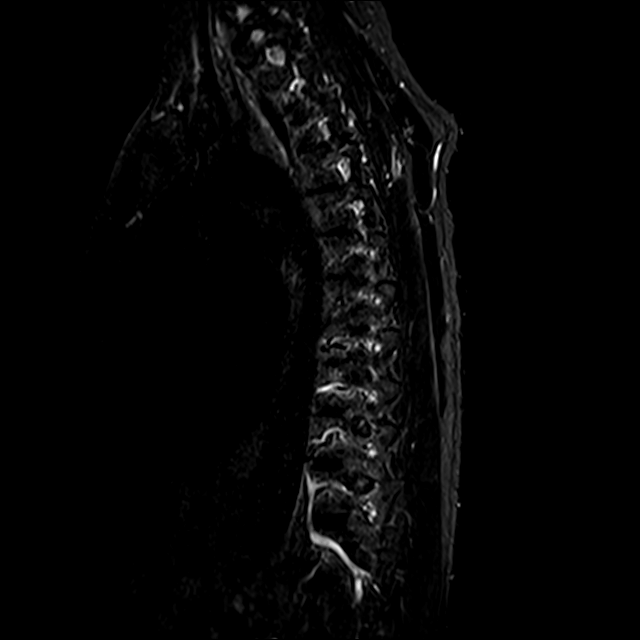
[im 7/17]
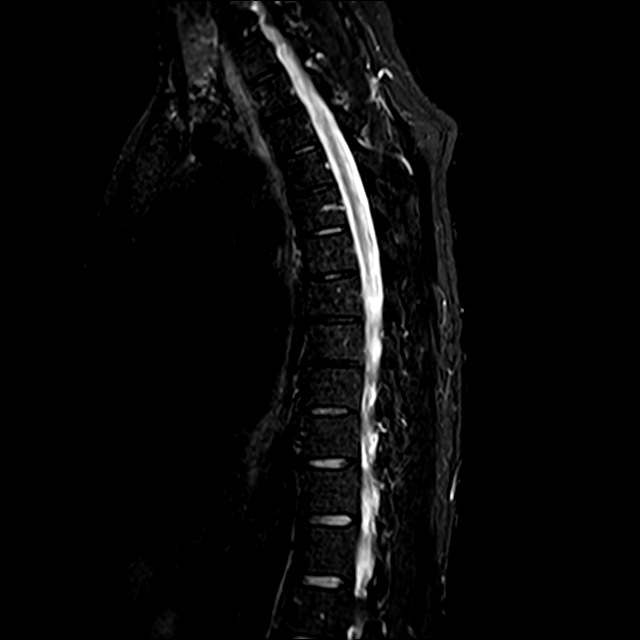
[im 10/17]
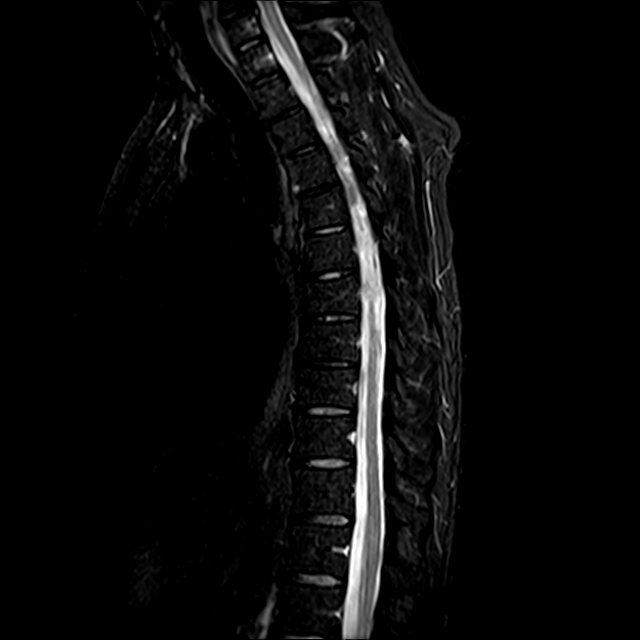
[im 13/17]
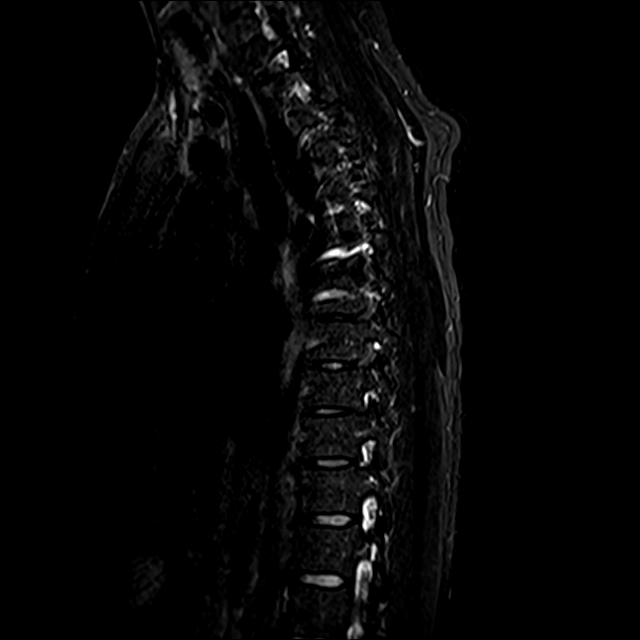
[im 17/17]
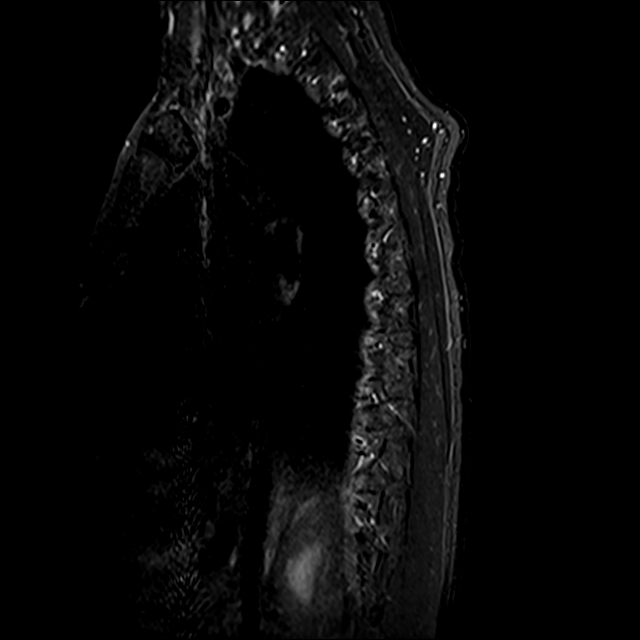

[Series 20: T2 · axial · 4.0mm · 0.59mm/px · z∈[-360,-117]mm · 8 of 39 slices shown (2 of 2)]
[im 1/39]
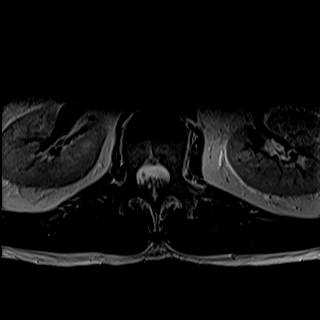
[im 6/39]
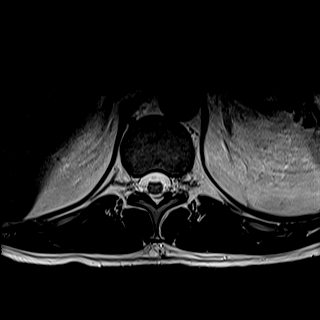
[im 12/39]
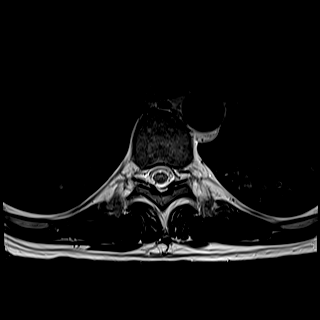
[im 18/39]
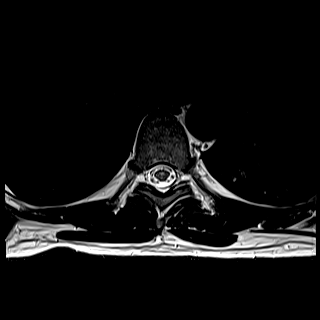
[im 21/39]
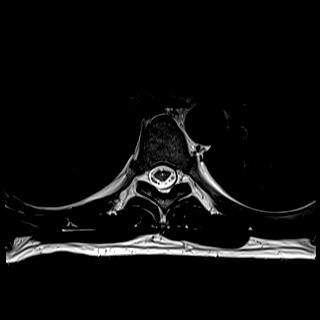
[im 27/39]
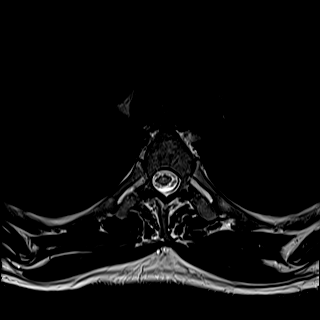
[im 33/39]
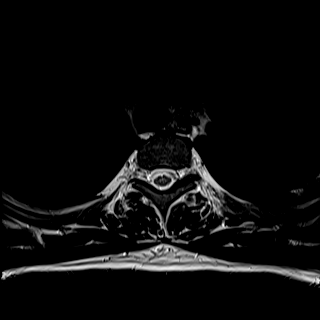
[im 39/39]
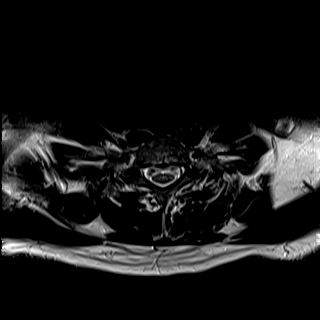

[Series 21: GRE · axial · 4.0mm · 0.37mm/px · 1 of 39 slices shown]
[im 1/39]
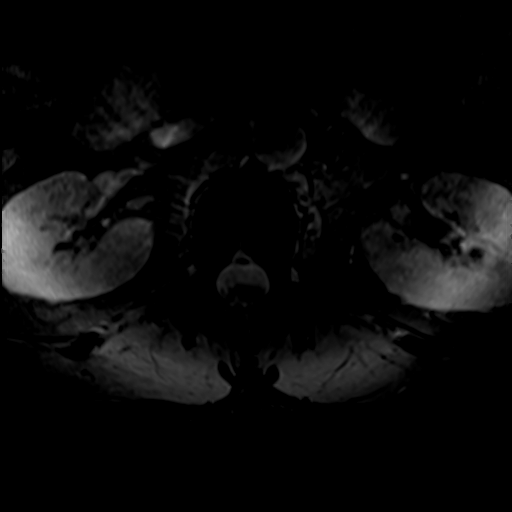

[29 of 48 positions shown; findings below may reference images not displayed]

FINDINGS: MRI CERVICAL SPINE FINDINGS

Alignment: Physiologic.

Vertebrae: No fracture, evidence of discitis, or bone lesion.

Cord: Normal signal and morphology.

Posterior Fossa, vertebral arteries, paraspinal tissues: Negative.

Disc levels:

C2-3: Spinal canal or neural foraminal stenosis.

C3-4: Tiny posterior disc protrusion. No spinal canal or neural
foraminal stenosis.

C4-5: Tiny posterior disc protrusion. No spinal canal or neural
foraminal stenosis.

C5-6: Tiny posterior disc protrusion. No spinal canal or neural
foraminal stenosis.

C6-7: Posterior disc protrusion causing small indentation on the
thecal sac without significant spinal canal stenosis. Uncovertebral
degenerative changes resulting in mild right neural foraminal
narrowing.

C7-T1: Small posterior disc protrusion causing indentation on the
thecal sac without significant spinal canal or neural foraminal
stenosis.

MRI THORACIC SPINE FINDINGS

Alignment:  Physiologic.

Vertebrae: No fracture, evidence of discitis, or bone lesion.

Cord:  Normal signal and morphology.

Paraspinal and other soft tissues: Negative.

Disc levels:

Small posterior disc protrusions at T6-7 and T8-9.

No significant spinal canal or neural foraminal stenosis at any
thoracic level.
IMPRESSION: Mild degenerative changes of the cervical and thoracic spine. No
high-grade spinal canal or neural foraminal stenosis at any level in
the cervical or thoracic spine.

## 2020-09-02 IMAGING — MR MR CERVICAL SPINE W/O CM
5 series · 39 of 48 positions shown · non-contrast
Comparison: None.

CLINICAL DATA: Chronic neck pain.

Chronic midline thoracic back pain.
EXAM:
MRI CERVICAL AND THORACIC SPINE WITHOUT CONTRAST
TECHNIQUE: Multiplanar and multiecho pulse sequences of the cervical spine, to
include the craniocervical junction and cervicothoracic junction,
and the thoracic spine, were obtained without intravenous contrast.

[Series 5: T2 · sagittal · 3.0mm · 0.62mm/px · 6 of 15 slices shown (1 of 2)]
[im 1/15]
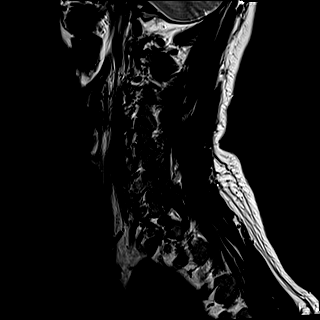
[im 3/15]
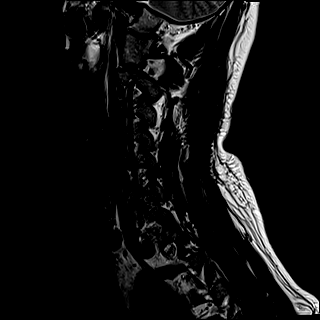
[im 6/15]
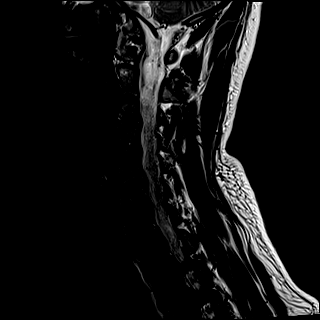
[im 9/15]
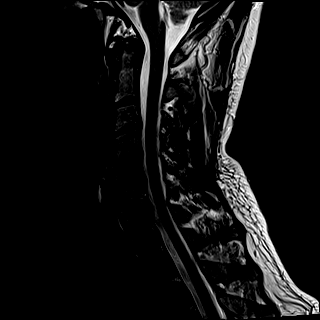
[im 12/15]
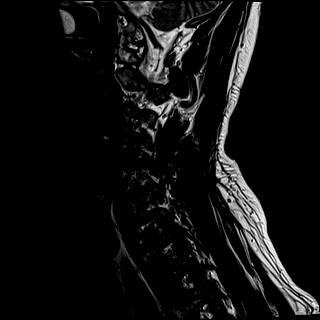
[im 15/15]
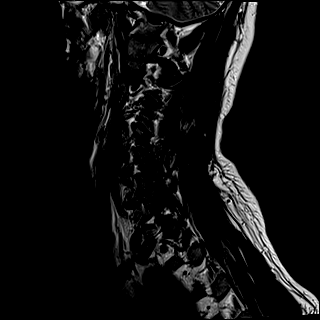

[Series 6: FLAIR · sagittal · 3.0mm · 0.78mm/px · 7 of 15 slices shown]
[im 1/15]
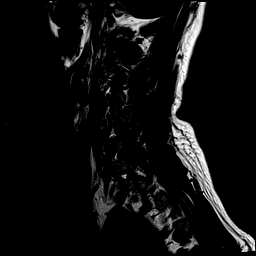
[im 3/15]
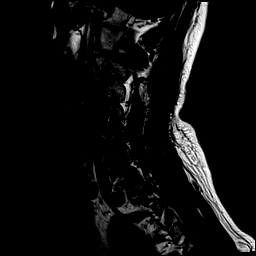
[im 5/15]
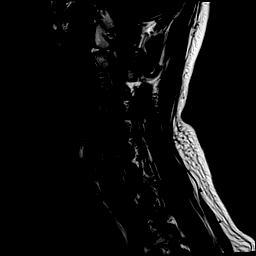
[im 8/15]
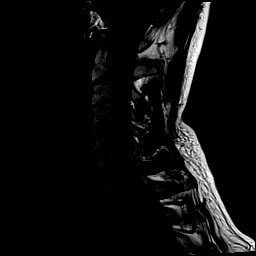
[im 10/15]
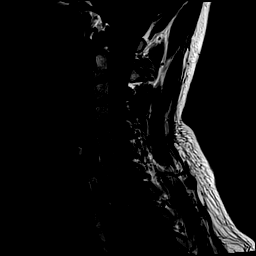
[im 12/15]
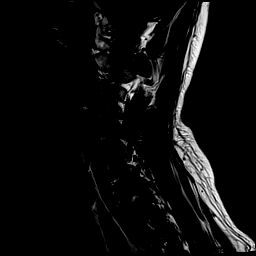
[im 15/15]
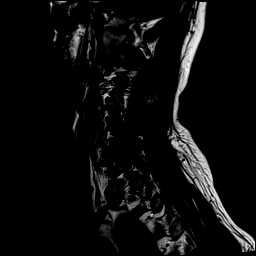

[Series 7: STIR · sagittal · 3.0mm · 0.62mm/px · 7 of 15 slices shown]
[im 1/15]
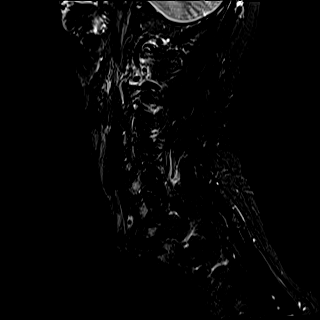
[im 3/15]
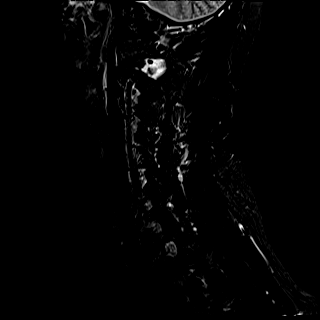
[im 5/15]
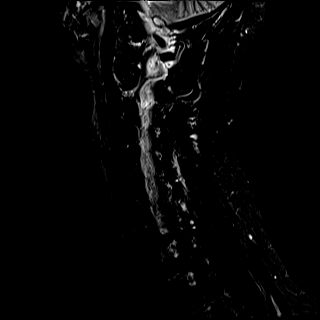
[im 8/15]
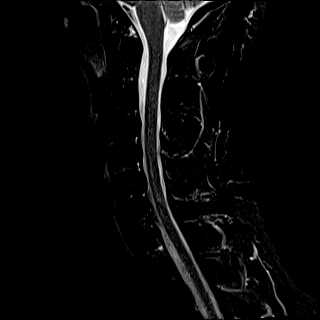
[im 10/15]
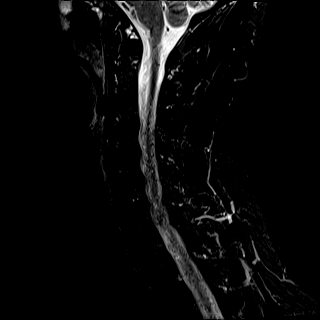
[im 12/15]
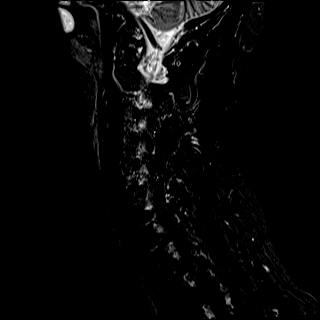
[im 15/15]
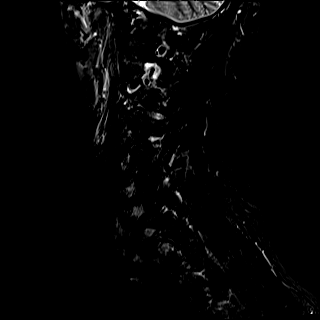

[Series 8: T2 · axial · 3.0mm · 0.70mm/px · z∈[-100,-5]mm · 11 of 29 slices shown (2 of 2)]
[im 1/29]
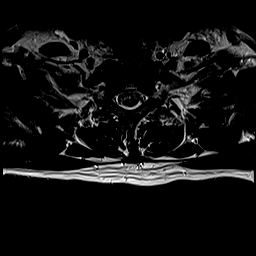
[im 3/29]
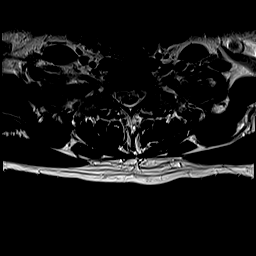
[im 5/29]
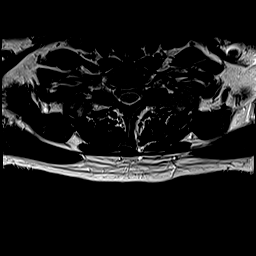
[im 7/29]
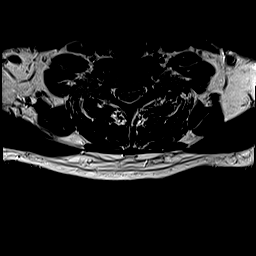
[im 9/29]
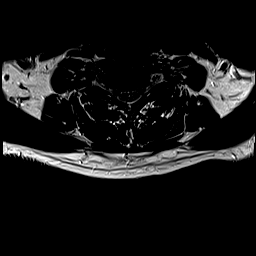
[im 11/29]
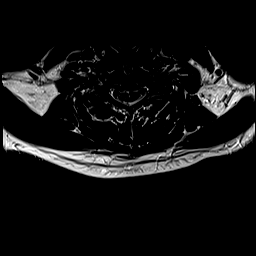
[im 13/29]
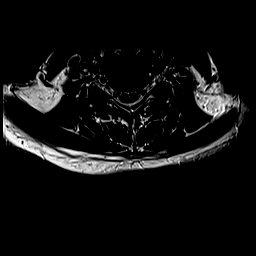
[im 16/29]
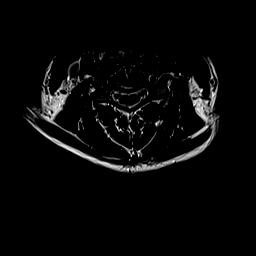
[im 20/29]
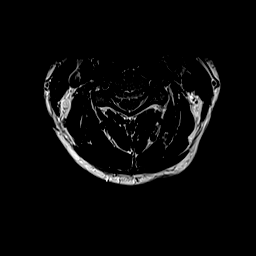
[im 24/29]
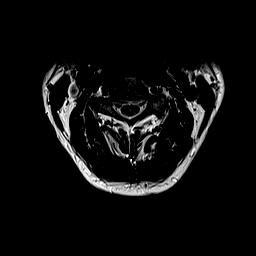
[im 29/29]
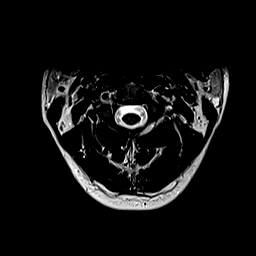

[Series 9: ax mpgr · axial · 3.0mm · 0.35mm/px · z∈[-100,-5]mm · 8 of 29 slices shown]
[im 1/29]
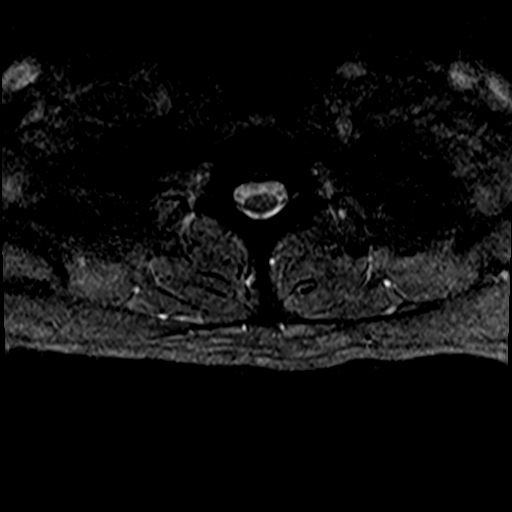
[im 5/29]
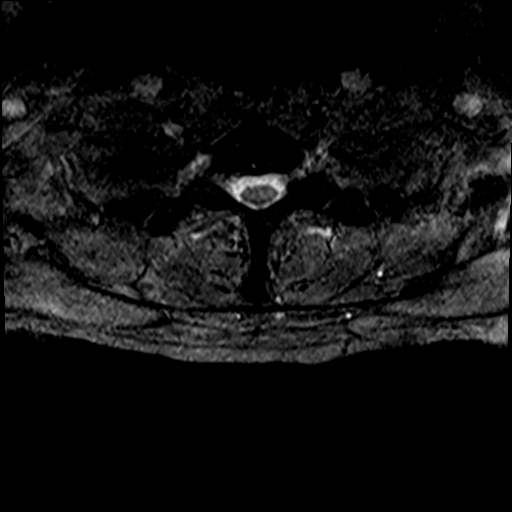
[im 9/29]
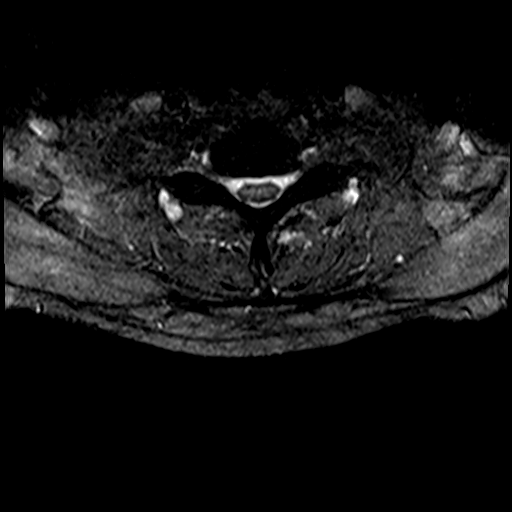
[im 13/29]
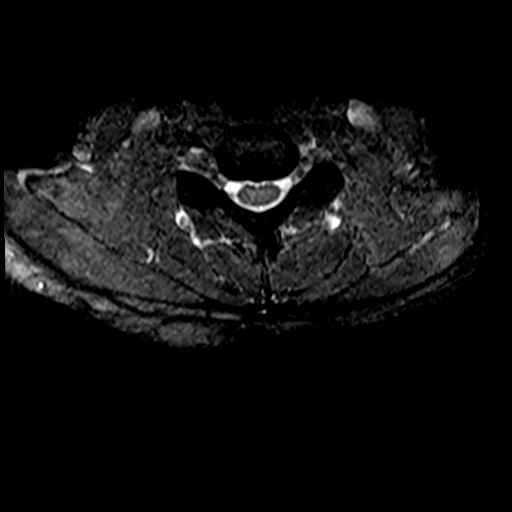
[im 16/29]
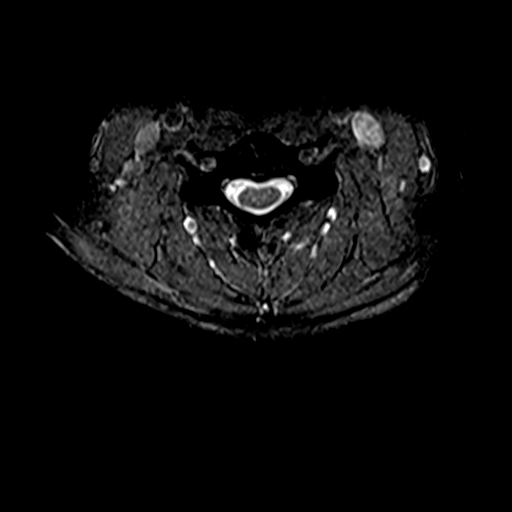
[im 20/29]
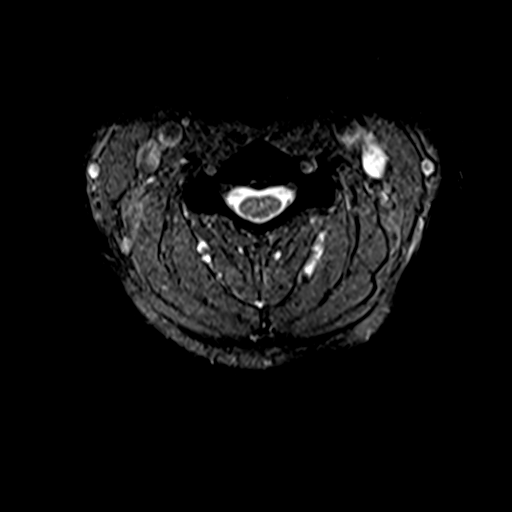
[im 24/29]
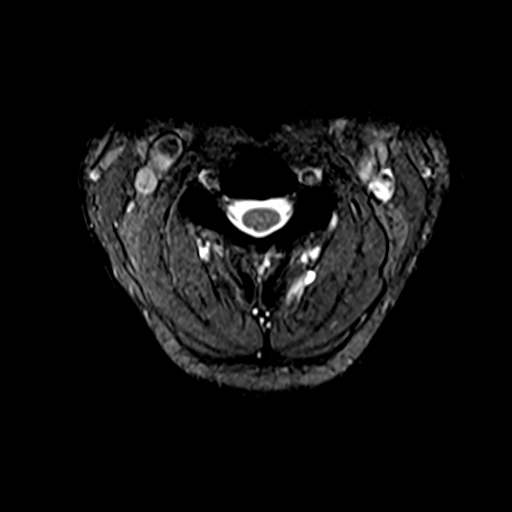
[im 29/29]
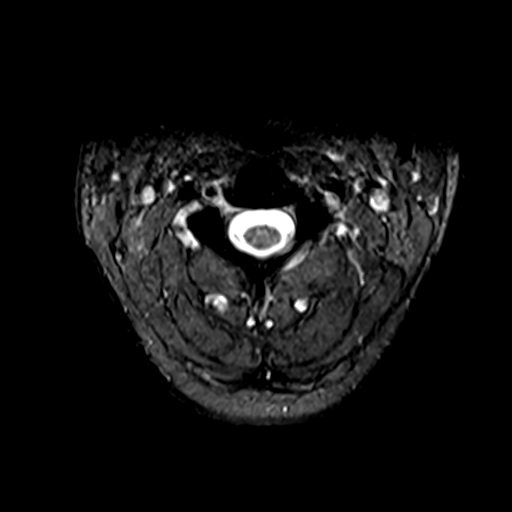

[39 of 48 positions shown; findings below may reference images not displayed]

FINDINGS: MRI CERVICAL SPINE FINDINGS

Alignment: Physiologic.

Vertebrae: No fracture, evidence of discitis, or bone lesion.

Cord: Normal signal and morphology.

Posterior Fossa, vertebral arteries, paraspinal tissues: Negative.

Disc levels:

C2-3: Spinal canal or neural foraminal stenosis.

C3-4: Tiny posterior disc protrusion. No spinal canal or neural
foraminal stenosis.

C4-5: Tiny posterior disc protrusion. No spinal canal or neural
foraminal stenosis.

C5-6: Tiny posterior disc protrusion. No spinal canal or neural
foraminal stenosis.

C6-7: Posterior disc protrusion causing small indentation on the
thecal sac without significant spinal canal stenosis. Uncovertebral
degenerative changes resulting in mild right neural foraminal
narrowing.

C7-T1: Small posterior disc protrusion causing indentation on the
thecal sac without significant spinal canal or neural foraminal
stenosis.

MRI THORACIC SPINE FINDINGS

Alignment:  Physiologic.

Vertebrae: No fracture, evidence of discitis, or bone lesion.

Cord:  Normal signal and morphology.

Paraspinal and other soft tissues: Negative.

Disc levels:

Small posterior disc protrusions at T6-7 and T8-9.

No significant spinal canal or neural foraminal stenosis at any
thoracic level.
IMPRESSION: Mild degenerative changes of the cervical and thoracic spine. No
high-grade spinal canal or neural foraminal stenosis at any level in
the cervical or thoracic spine.

## 2020-09-02 IMAGING — MR MR LUMBAR SPINE W/O CM
5 series · 31 of 48 positions shown · non-contrast
Comparison: None.

CLINICAL DATA: Lumbar radiculopathy.  Chronic low back pain.

EXAM:
MRI LUMBAR SPINE WITHOUT CONTRAST
TECHNIQUE: Multiplanar, multisequence MR imaging of the lumbar spine was
performed. No intravenous contrast was administered.

[Series 1: T2 · sagittal · 4.0mm · 0.81mm/px · 6 of 17 slices shown (1 of 2)]
[im 1/17]
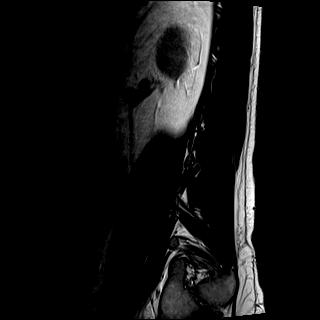
[im 4/17]
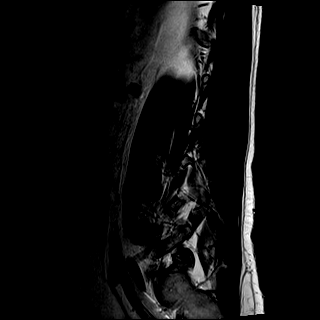
[im 7/17]
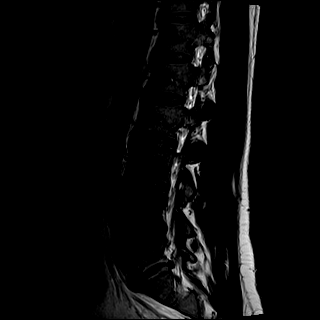
[im 10/17]
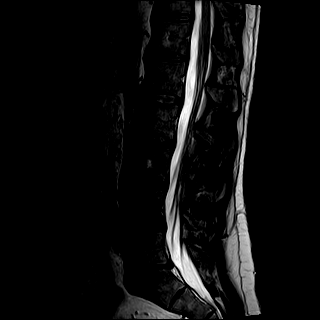
[im 13/17]
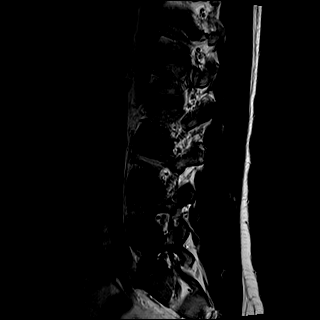
[im 17/17]
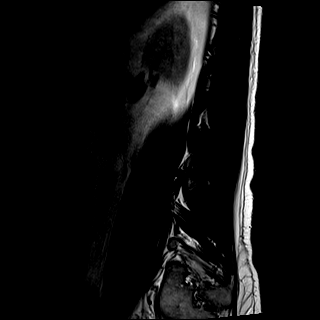

[Series 2: T1 · sagittal · 4.0mm · 0.81mm/px · 7 of 17 slices shown (1 of 2)]
[im 1/17]
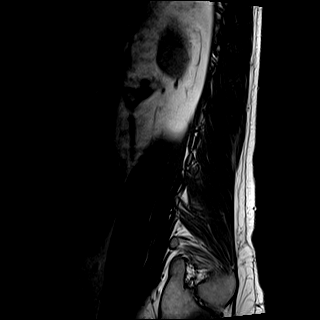
[im 3/17]
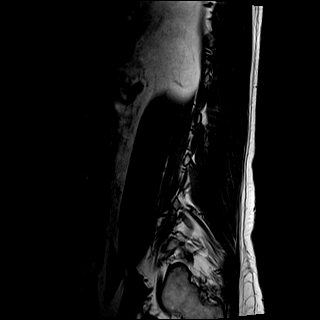
[im 6/17]
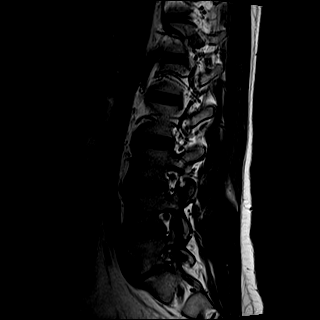
[im 9/17]
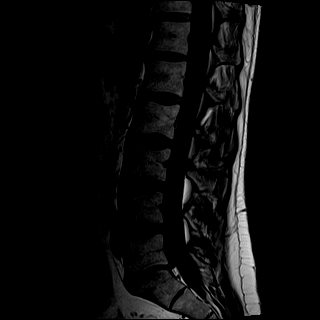
[im 11/17]
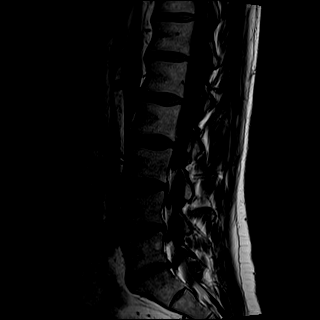
[im 14/17]
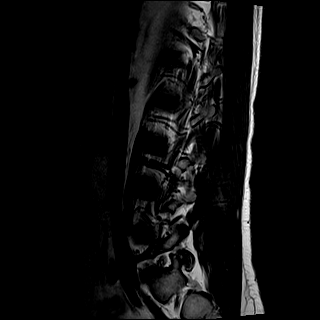
[im 17/17]
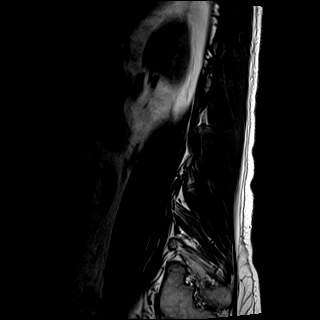

[Series 3: STIR · sagittal · 4.0mm · 0.41mm/px · 2 of 17 slices shown]
[im 1/17]
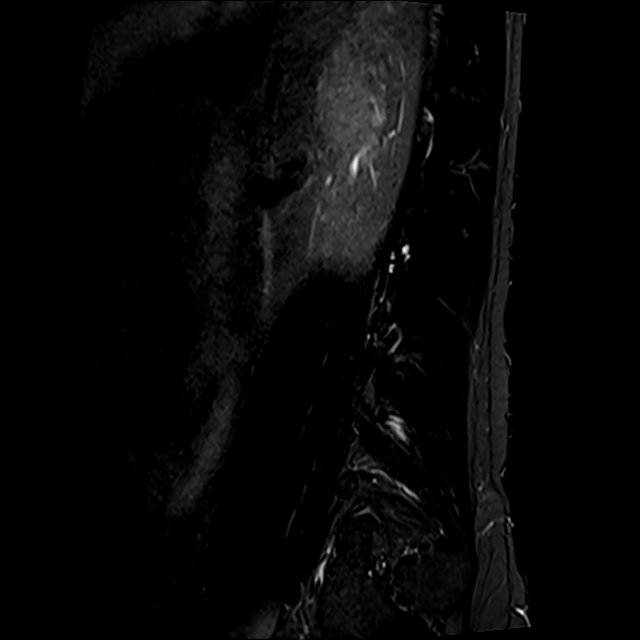
[im 3/17]
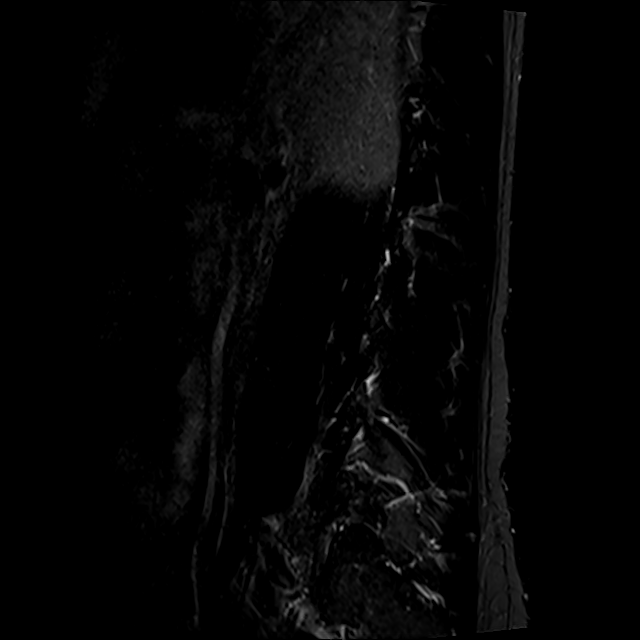

[Series 4: T2 · axial · 4.0mm · 0.78mm/px · z∈[-564,-348]mm · 8 of 35 slices shown (2 of 2)]
[im 1/35]
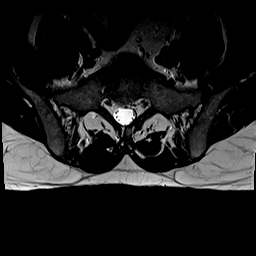
[im 6/35]
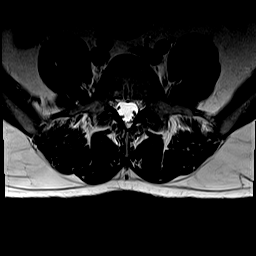
[im 11/35]
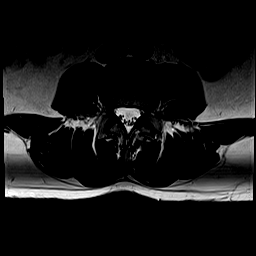
[im 16/35]
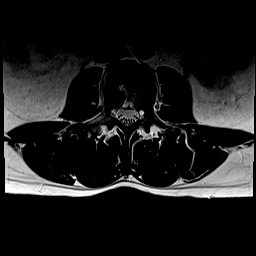
[im 19/35]
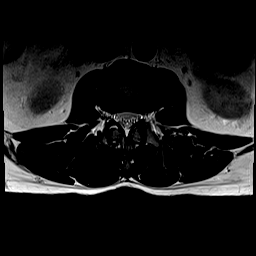
[im 24/35]
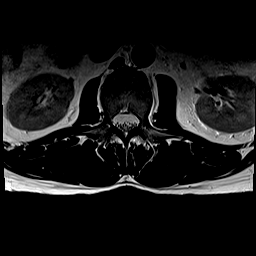
[im 29/35]
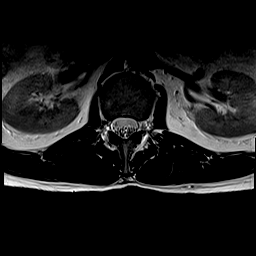
[im 35/35]
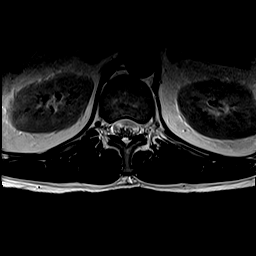

[Series 5: T1 · axial · 4.0mm · 0.39mm/px · z∈[-564,-348]mm · 8 of 35 slices shown (2 of 2)]
[im 1/35]
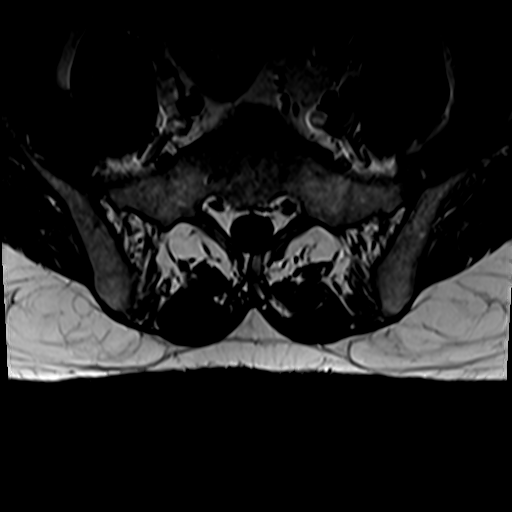
[im 6/35]
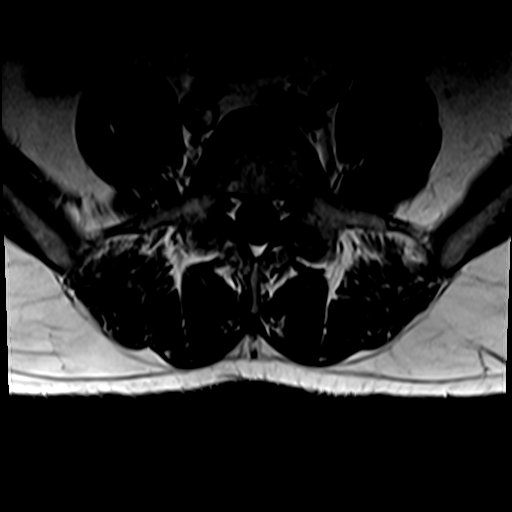
[im 11/35]
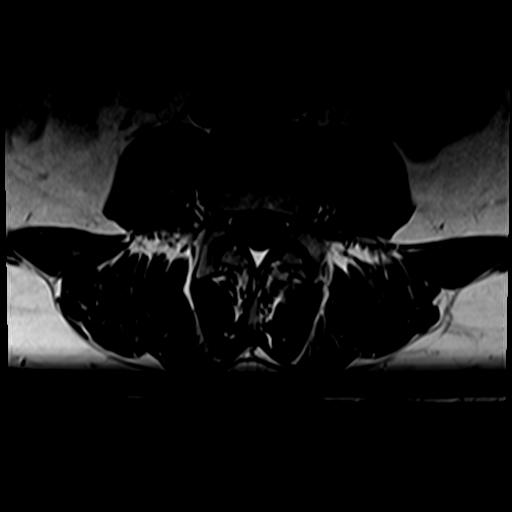
[im 16/35]
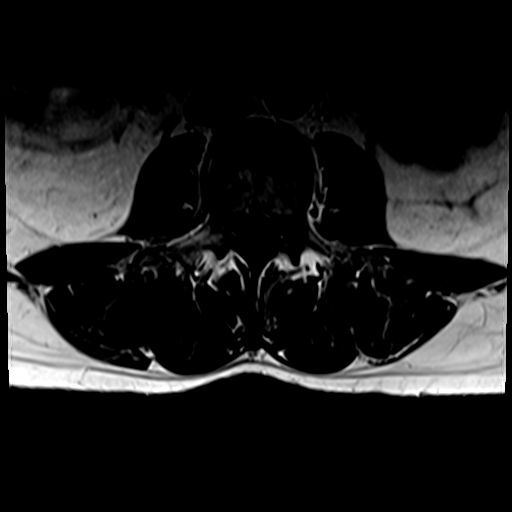
[im 19/35]
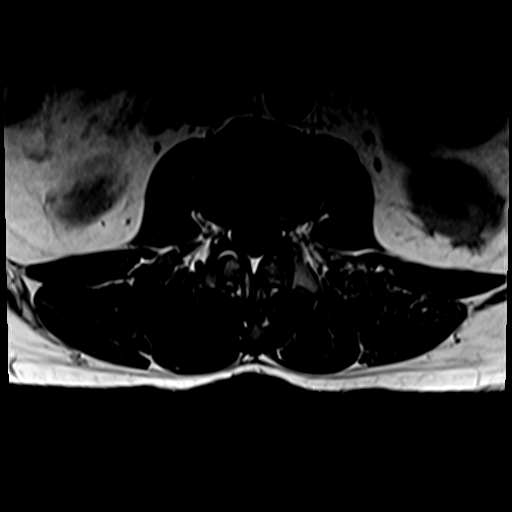
[im 24/35]
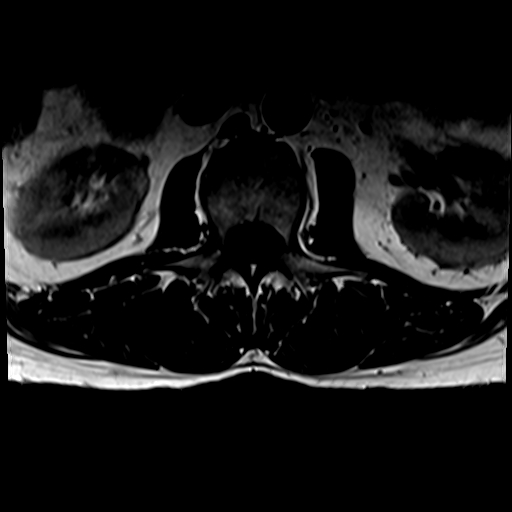
[im 29/35]
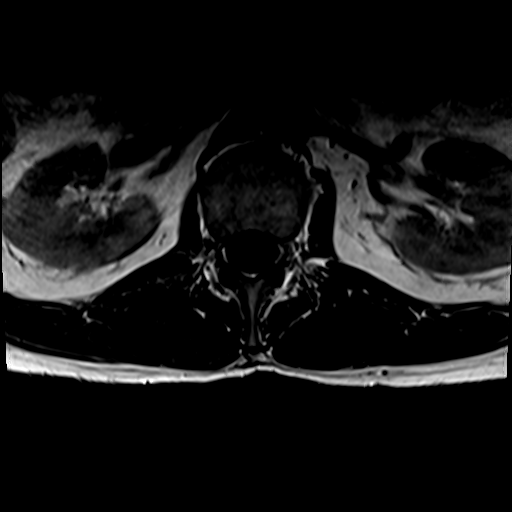
[im 35/35]
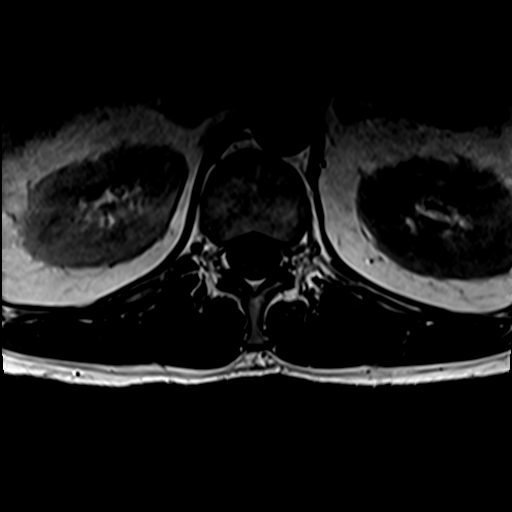

[31 of 48 positions shown; findings below may reference images not displayed]

FINDINGS: Segmentation:  Standard.

Alignment:  Physiologic.

Vertebrae: No fracture, evidence of discitis, or bone lesion.
Endplate degenerative changes at L5-S1.

Conus medullaris and cauda equina: Conus extends to the L1 level.
Conus and cauda equina appear normal.

Paraspinal and other soft tissues: Negative.

Disc levels:

T12-L1 through L3-4: No spinal canal or neural foraminal stenosis.

L4-5: Shallow disc bulge and mild facet degenerative changes without
significant spinal canal or neural foraminal stenosis.

L5-S1: Small left central disc protrusion resulting in mild
narrowing of the left subarticular zone and mild left neural
foraminal narrowing. No spinal canal stenosis.
IMPRESSION: Mild degenerative changes of the lower lumbar spine, worst at L5-S1
where there is mild narrowing of the left subarticular zone and mild
left neural foraminal narrowing.

## 2020-09-09 ENCOUNTER — Telehealth: Payer: Self-pay

## 2020-09-09 NOTE — Telephone Encounter (Signed)
Sending to [facility] so we can get this schedule.

## 2020-09-11 ENCOUNTER — Other Ambulatory Visit: Payer: BC Managed Care – PPO

## 2020-09-13 ENCOUNTER — Telehealth: Payer: Self-pay

## 2020-09-13 NOTE — Telephone Encounter (Signed)
Left a message and advised pt of echo appointment. Catha Ontko

## 2020-09-23 ENCOUNTER — Ambulatory Visit: Admission: RE | Admit: 2020-09-23 | Payer: BC Managed Care – PPO | Source: Ambulatory Visit

## 2020-10-15 ENCOUNTER — Other Ambulatory Visit: Payer: Self-pay

## 2020-10-15 ENCOUNTER — Ambulatory Visit: Payer: BC Managed Care – PPO | Admitting: Hospice and Palliative Medicine

## 2020-10-15 ENCOUNTER — Encounter: Payer: Self-pay | Admitting: Hospice and Palliative Medicine

## 2020-10-15 VITALS — BP 116/78 | HR 75 | Temp 97.7°F | Resp 16 | Ht 72.0 in | Wt 184.8 lb

## 2020-10-15 DIAGNOSIS — M5136 Other intervertebral disc degeneration, lumbar region: Secondary | ICD-10-CM

## 2020-10-15 DIAGNOSIS — M5126 Other intervertebral disc displacement, lumbar region: Secondary | ICD-10-CM | POA: Diagnosis not present

## 2020-10-15 DIAGNOSIS — I517 Cardiomegaly: Secondary | ICD-10-CM | POA: Diagnosis not present

## 2020-10-15 MED ORDER — CARISOPRODOL 350 MG PO TABS: 350.0000 mg | ORAL_TABLET | Freq: Two times a day (BID) | ORAL | 0 refills | Status: DC

## 2020-10-15 MED ORDER — METHOCARBAMOL 750 MG PO TABS: 750.0000 mg | ORAL_TABLET | Freq: Four times a day (QID) | ORAL | 0 refills | Status: DC

## 2020-10-15 NOTE — Progress Notes (Signed)
[facility] [address] [place], [place] [address]  Internal MEDICINE  Office Visit Note  Patient Name: Vincent Huffman  [id number]  [id number]  Date of Service: 10/17/2020  Chief Complaint  Patient presents with  . Acute Visit    Trouble falling asleep and staying asleep, lower back pain and mid back pain and neck pain started about a month ago, review imaging      HPI Pt is here for a sick visit. Has not yet had echocardiogram yet due to findings of cardiomegaly on recent imaging Continues to deny symptoms of shortness of breath on exertion, chest pain or lower extremity edema  C/o recurrent back pain In the past we have tried a multitude of different therapy options---again he does not feel he is at the point where he wishes to be seen by pain management or to undergo spinal injections Continues to utilize many natural remedies and herbal supplements to help with controlling his pain Pain is starting to affect his sleep Many of the medication options we have tried in the past have caused drowsiness which make it unable for him to work or function during the day  He continues with daily stretches and physical activity He would like to try Soma as needed for nights that the pain is excessive and Robaxin for lesser painful days and during the day as this does not cause him drowsiness  Current Medication:  Outpatient Encounter Medications as of 10/15/2020  Medication Sig  . carisoprodol (SOMA) 350 MG tablet Take 1 tablet (350 mg total) by mouth in the morning and at bedtime.  . methocarbamol (ROBAXIN-750) 750 MG tablet Take 1 tablet (750 mg total) by mouth 4 (four) times daily.  . [DISCONTINUED] ranitidine (ZANTAC) 150 MG capsule Take 1 capsule (150 mg total) by mouth 2 (two) times daily.   No facility-administered encounter medications on file as of 10/15/2020.      Medical History: Past Medical History:  Diagnosis Date  . Chronic reflux esophagitis   .  Degenerative joint disease (DJD) of lumbar spine   . Esophagitis   . GERD (gastroesophageal reflux disease)      Vital Signs: BP 116/78   Pulse 75   Temp 97.7 F (36.5 C)   Resp 16   Ht 6' (1.829 m)   Wt 184 lb 12.8 oz (83.8 kg)   SpO2 98%   BMI 25.06 kg/m    Review of Systems  Constitutional: Negative for chills, fatigue and unexpected weight change.  HENT: Negative for congestion, postnasal drip, rhinorrhea, sneezing and sore throat.   Eyes: Negative for redness.  Respiratory: Negative for cough, chest tightness and shortness of breath.   Cardiovascular: Negative for chest pain and palpitations.  Gastrointestinal: Negative for abdominal pain, constipation, diarrhea, nausea and vomiting.  Genitourinary: Negative for dysuria and frequency.  Musculoskeletal: Positive for arthralgias and back pain. Negative for joint swelling and neck pain.  Skin: Negative for rash.  Neurological: Negative for tremors and numbness.  Hematological: Negative for adenopathy. Does not bruise/bleed easily.  Psychiatric/Behavioral: Negative for behavioral problems (Depression), sleep disturbance and suicidal ideas. The patient is not nervous/anxious.     Physical Exam Vitals reviewed.  Constitutional:      Appearance: Normal appearance. He is normal weight.  Cardiovascular:     Rate and Rhythm: Normal rate and regular rhythm.     Pulses: Normal pulses.     Heart sounds: Normal heart sounds.  Pulmonary:     Effort: Pulmonary effort is  normal.     Breath sounds: Normal breath sounds.  Abdominal:     General: Abdomen is flat.     Palpations: Abdomen is soft.  Musculoskeletal:     Cervical back: Normal range of motion.  Skin:    General: Skin is warm.  Neurological:     General: No focal deficit present.     Mental Status: He is alert and oriented to person, place, and time. Mental status is at baseline.  Psychiatric:        Mood and Affect: Mood normal.        Behavior: Behavior normal.         Thought Content: Thought content normal.        Judgment: Judgment normal.   Assessment/Plan: 1. Bulge of lumbar disc without myelopathy Start Soma as needed for severe pain, may use Robaxin for less severe pain - methocarbamol (ROBAXIN-750) 750 MG tablet; Take 1 tablet (750 mg total) by mouth 4 (four) times daily.  Dispense: 180 tablet; Refill: 0 - carisoprodol (SOMA) 350 MG tablet; Take 1 tablet (350 mg total) by mouth in the morning and at bedtime.  Dispense: 60 tablet; Refill: 0  2. Cardiomegaly Needs to be scheduled for echocardiogram for further evaluation  General Counseling: Vincent Huffman verbalizes understanding of the findings of todays visit and agrees with plan of treatment. I have discussed any further diagnostic evaluation that may be needed or ordered today. We also reviewed his medications today. he has been encouraged to call the office with any questions or concerns that should arise related to todays visit.  Meds ordered this encounter  Medications  . methocarbamol (ROBAXIN-750) 750 MG tablet    Sig: Take 1 tablet (750 mg total) by mouth 4 (four) times daily.    Dispense:  180 tablet    Refill:  0  . carisoprodol (SOMA) 350 MG tablet    Sig: Take 1 tablet (350 mg total) by mouth in the morning and at bedtime.    Dispense:  60 tablet    Refill:  0    Time spent: 30 Minutes  This patient was seen by Leeanne Deed AGNP-C in Collaboration with Dr Lyndon Code as a part of collaborative care agreement.  Lubertha Basque [facility] Internal Medicine

## 2020-10-17 ENCOUNTER — Encounter: Payer: Self-pay | Admitting: Hospice and Palliative Medicine

## 2020-10-21 ENCOUNTER — Encounter: Payer: Self-pay | Admitting: Hospice and Palliative Medicine

## 2020-10-21 ENCOUNTER — Other Ambulatory Visit: Payer: Self-pay | Admitting: Hospice and Palliative Medicine

## 2020-10-21 MED ORDER — TIZANIDINE HCL 4 MG PO TABS: 4.0000 mg | ORAL_TABLET | Freq: Four times a day (QID) | ORAL | 1 refills | Status: DC | PRN

## 2020-10-22 ENCOUNTER — Other Ambulatory Visit: Payer: Self-pay | Admitting: Hospice and Palliative Medicine

## 2020-10-22 MED ORDER — MELOXICAM 7.5 MG PO TABS: 7.5000 mg | ORAL_TABLET | Freq: Every day | ORAL | 2 refills | Status: DC

## 2020-10-24 ENCOUNTER — Ambulatory Visit: Payer: BC Managed Care – PPO | Admitting: Hospice and Palliative Medicine

## 2020-10-24 ENCOUNTER — Encounter: Payer: Self-pay | Admitting: Hospice and Palliative Medicine

## 2020-10-24 ENCOUNTER — Other Ambulatory Visit: Payer: Self-pay

## 2020-10-24 VITALS — BP 122/90 | HR 75 | Temp 97.5°F | Resp 16 | Ht 72.0 in | Wt 179.8 lb

## 2020-10-24 DIAGNOSIS — M5126 Other intervertebral disc displacement, lumbar region: Secondary | ICD-10-CM

## 2020-10-24 DIAGNOSIS — M5441 Lumbago with sciatica, right side: Secondary | ICD-10-CM

## 2020-10-24 DIAGNOSIS — M5442 Lumbago with sciatica, left side: Secondary | ICD-10-CM | POA: Diagnosis not present

## 2020-10-24 DIAGNOSIS — G8929 Other chronic pain: Secondary | ICD-10-CM

## 2020-10-24 DIAGNOSIS — M5136 Other intervertebral disc degeneration, lumbar region: Secondary | ICD-10-CM

## 2020-10-24 DIAGNOSIS — T7840XA Allergy, unspecified, initial encounter: Secondary | ICD-10-CM

## 2020-10-24 NOTE — Progress Notes (Signed)
[facility] [address] [place], [place] [address]  Internal MEDICINE  Office Visit Note  Patient Name: Vincent Huffman  [id number]  [id number]  Date of Service: 10/25/2020  Chief Complaint  Patient presents with  . Acute Visit    Eye pain, was both but now mostly left, oxygen was low 90's when falling asleep and waking up     HPI Pt is here for a sick visit. Low back pain has somewhat improved Concerned he had an allergic reaction to Meloxicam but could have been combination of prescription medication and natural/herbal supplements Last night had dizziness, pain in his left eye and insomnia Feeling much better today, all symptoms have resolved  At this point, we have exhausted several mediation options to help control his low back pain  Current Medication:  Outpatient Encounter Medications as of 10/24/2020  Medication Sig  . tiZANidine (ZANAFLEX) 4 MG tablet Take 1 tablet (4 mg total) by mouth every 6 (six) hours as needed for muscle spasms.  . meloxicam (MOBIC) 7.5 MG tablet Take 1 tablet (7.5 mg total) by mouth daily.  . [DISCONTINUED] ranitidine (ZANTAC) 150 MG capsule Take 1 capsule (150 mg total) by mouth 2 (two) times daily.   No facility-administered encounter medications on file as of 10/24/2020.      Medical History: Past Medical History:  Diagnosis Date  . Chronic reflux esophagitis   . Degenerative joint disease (DJD) of lumbar spine   . Esophagitis   . GERD (gastroesophageal reflux disease)      Vital Signs: BP 122/90   Pulse 75   Temp (!) 97.5 F (36.4 C)   Resp 16   Ht 6' (1.829 m)   Wt 179 lb 12.8 oz (81.6 kg)   SpO2 99%   BMI 24.39 kg/m    Review of Systems  Constitutional: Negative for chills, fatigue and unexpected weight change.  HENT: Negative for congestion, postnasal drip, rhinorrhea, sneezing and sore throat.   Eyes: Negative for redness.  Respiratory: Negative for cough, chest tightness and shortness of breath.    Cardiovascular: Negative for chest pain and palpitations.  Gastrointestinal: Negative for abdominal pain, constipation, diarrhea, nausea and vomiting.  Genitourinary: Negative for dysuria and frequency.  Musculoskeletal: Positive for back pain. Negative for arthralgias, joint swelling and neck pain.  Skin: Negative for rash.  Neurological: Negative for tremors and numbness.  Hematological: Negative for adenopathy. Does not bruise/bleed easily.  Psychiatric/Behavioral: Negative for behavioral problems (Depression), sleep disturbance and suicidal ideas. The patient is not nervous/anxious.     Physical Exam Vitals reviewed.  Constitutional:      Appearance: Normal appearance. He is normal weight.  Cardiovascular:     Rate and Rhythm: Normal rate and regular rhythm.     Pulses: Normal pulses.     Heart sounds: Normal heart sounds.  Pulmonary:     Effort: Pulmonary effort is normal.     Breath sounds: Normal breath sounds.  Musculoskeletal:        General: Normal range of motion.     Cervical back: Normal range of motion.  Skin:    General: Skin is warm.  Neurological:     General: No focal deficit present.     Mental Status: He is alert and oriented to person, place, and time. Mental status is at baseline.  Psychiatric:        Mood and Affect: Mood normal.        Behavior: Behavior normal.  Thought Content: Thought content normal.        Judgment: Judgment normal.    Assessment/Plan: 1. Allergic reaction, initial encounter All symptoms resolved at this time, will review labs, advised to avoid mixture of prescribed and natural supplements, introduce one medication at a time to control pain - CBC w/Diff/Platelet - Comprehensive Metabolic Panel (CMET)  2. Bulge of lumbar disc without myelopathy Referral to [facility] as he is interested in alternative treatment options to help control his pain - Ambulatory referral to Neurosurgery  3.  Chronic midline low back pain with bilateral sciatica Referral to [facility] as he is interested in alternative treatment options to help control his pain - Ambulatory referral to Neurosurgery  General Counseling: [name] verbalizes understanding of the findings of todays visit and agrees with plan of treatment. I have discussed any further diagnostic evaluation that may be needed or ordered today. We also reviewed his medications today. he has been encouraged to call the office with any questions or concerns that should arise related to todays visit.   Orders Placed This Encounter  Procedures  . CBC w/Diff/Platelet  . Comprehensive Metabolic Panel (CMET)  . Ambulatory referral to Neurosurgery    Time spent: 30 Minutes Time spent includes review of chart, medications, test results and follow-up plan with the patient.  This patient was seen by Leeanne Deed AGNP-C in Collaboration with Dr Lyndon Code as a part of collaborative care agreement.  Lubertha Basque [facility] Internal Medicine

## 2020-10-25 ENCOUNTER — Encounter: Payer: Self-pay | Admitting: Hospice and Palliative Medicine

## 2020-10-25 LAB — COMPREHENSIVE METABOLIC PANEL
ALT: 16 IU/L (ref 0–44)
AST: 19 IU/L (ref 0–40)
Albumin/Globulin Ratio: 2.3 — ABNORMAL HIGH (ref 1.2–2.2)
Albumin: 4.9 g/dL (ref 4.0–5.0)
Alkaline Phosphatase: 58 IU/L (ref 44–121)
BUN/Creatinine Ratio: 14 (ref 9–20)
BUN: 12 mg/dL (ref 6–24)
Bilirubin Total: 0.9 mg/dL (ref 0.0–1.2)
CO2: 23 mmol/L (ref 20–29)
Calcium: 9.7 mg/dL (ref 8.7–10.2)
Chloride: 101 mmol/L (ref 96–106)
Creatinine, Ser: 0.87 mg/dL (ref 0.76–1.27)
Globulin, Total: 2.1 g/dL (ref 1.5–4.5)
Glucose: 79 mg/dL (ref 65–99)
Potassium: 4.9 mmol/L (ref 3.5–5.2)
Sodium: 145 mmol/L — ABNORMAL HIGH (ref 134–144)
Total Protein: 7 g/dL (ref 6.0–8.5)
eGFR: 109 mL/min/{1.73_m2} (ref >59)

## 2020-10-25 LAB — CBC WITH DIFFERENTIAL/PLATELET
Basophils Absolute: 0 10*3/uL (ref 0.0–0.2)
Basos: 1 %
EOS (ABSOLUTE): 0.1 10*3/uL (ref 0.0–0.4)
Eos: 2 %
Hematocrit: 41.2 % (ref 37.5–51.0)
Hemoglobin: 13.9 g/dL (ref 13.0–17.7)
Immature Grans (Abs): 0 10*3/uL (ref 0.0–0.1)
Immature Granulocytes: 0 %
Lymphocytes Absolute: 1.4 10*3/uL (ref 0.7–3.1)
Lymphs: 34 %
MCH: 31 pg (ref 26.6–33.0)
MCHC: 33.7 g/dL (ref 31.5–35.7)
MCV: 92 fL (ref 79–97)
Monocytes Absolute: 0.3 10*3/uL (ref 0.1–0.9)
Monocytes: 8 %
Neutrophils Absolute: 2.2 10*3/uL (ref 1.4–7.0)
Neutrophils: 55 %
Platelets: 232 10*3/uL (ref 150–450)
RBC: 4.49 x10E6/uL (ref 4.14–5.80)
RDW: 12.2 % (ref 11.6–15.4)
WBC: 4 10*3/uL (ref 3.4–10.8)

## 2020-10-30 ENCOUNTER — Other Ambulatory Visit: Payer: BC Managed Care – PPO

## 2020-10-30 DIAGNOSIS — I517 Cardiomegaly: Secondary | ICD-10-CM | POA: Diagnosis not present

## 2020-11-26 ENCOUNTER — Telehealth: Payer: Self-pay

## 2020-11-26 NOTE — Telephone Encounter (Signed)
Spoke with patient this morning to see if he has scheduled appointment with [facility].  He stated they left him vm yesterday. He will call them back at some point to schedule.-Toni

## 2020-11-28 ENCOUNTER — Other Ambulatory Visit: Payer: Self-pay

## 2020-11-28 ENCOUNTER — Ambulatory Visit: Payer: BC Managed Care – PPO | Admitting: Hospice and Palliative Medicine

## 2020-11-28 ENCOUNTER — Encounter: Payer: Self-pay | Admitting: Hospice and Palliative Medicine

## 2020-11-28 VITALS — BP 112/70 | HR 67 | Temp 97.4°F | Resp 16 | Ht 72.0 in | Wt 192.0 lb

## 2020-11-28 DIAGNOSIS — M5442 Lumbago with sciatica, left side: Secondary | ICD-10-CM

## 2020-11-28 DIAGNOSIS — G8929 Other chronic pain: Secondary | ICD-10-CM

## 2020-11-28 DIAGNOSIS — I77810 Thoracic aortic ectasia: Secondary | ICD-10-CM | POA: Diagnosis not present

## 2020-11-28 DIAGNOSIS — M5441 Lumbago with sciatica, right side: Secondary | ICD-10-CM

## 2020-11-28 NOTE — Progress Notes (Signed)
[facility] [address] [place], [place] [address]  Internal MEDICINE  Office Visit Note  Patient Name: Vincent Huffman  [id number]  [id number]  Date of Service: 11/28/2020  Chief Complaint  Patient presents with  . Follow-up    Discuss echo  . Gastroesophageal Reflux    HPI Patient is here for routine follow-up Reviewed echocardiogram: LV normal in size, normal systolic function, EF 67%, right atrial cavity mildly dilated, mildly dilated aortic root  Denies chest pain, palpitations, or shortness of breath BP remains well controlled  Low back pain well controlled at this time Not currently taking any prescription medications  Current Medication: Outpatient Encounter Medications as of 11/28/2020  Medication Sig  . tiZANidine (ZANAFLEX) 4 MG tablet Take 1 tablet (4 mg total) by mouth every 6 (six) hours as needed for muscle spasms.  . [DISCONTINUED] meloxicam (MOBIC) 7.5 MG tablet Take 1 tablet (7.5 mg total) by mouth daily.  . [DISCONTINUED] ranitidine (ZANTAC) 150 MG capsule Take 1 capsule (150 mg total) by mouth 2 (two) times daily.   No facility-administered encounter medications on file as of 11/28/2020.    Surgical History: Past Surgical History:  Procedure Laterality Date  . ADENOIDECTOMY    . ESOPHAGOGASTRODUODENOSCOPY N/A 02/28/2020   Procedure: ESOPHAGOGASTRODUODENOSCOPY (EGD);  Surgeon: Toledo, Boykin Nearing, MD;  Location: [facility] ENDOSCOPY;  Service: Gastroenterology;  Laterality: N/A;  . TONSILLECTOMY      Medical History: Past Medical History:  Diagnosis Date  . Chronic reflux esophagitis   . Degenerative joint disease (DJD) of lumbar spine   . Esophagitis   . GERD (gastroesophageal reflux disease)     Family History: Family History  Problem Relation Age of Onset  . Heart disease Mother   . Heart disease Father   . Diabetes Maternal Grandfather     Social History   Socioeconomic History  . Marital status: Single    Spouse name: Not on  file  . Number of children: Not on file  . Years of education: Not on file  . Highest education level: Not on file  Occupational History  . Occupation: Working Personnel officer   Tobacco Use  . Smoking status: Never Smoker  . Smokeless tobacco: Never Used  Vaping Use  . Vaping Use: Never used  Substance and Sexual Activity  . Alcohol use: No  . Drug use: No  . Sexual activity: Not on file  Other Topics Concern  . Not on file  Social History Narrative  . Not on file   Social Determinants of Health   Financial Resource Strain: Not on file  Food Insecurity: Not on file  Transportation Needs: Not on file  Physical Activity: Not on file  Stress: Not on file  Social Connections: Not on file  Intimate Partner Violence: Not on file      Review of Systems  Constitutional: Negative for chills, fatigue and unexpected weight change.  HENT: Negative for congestion, postnasal drip, rhinorrhea, sneezing and sore throat.   Eyes: Negative for redness.  Respiratory: Negative for cough, chest tightness and shortness of breath.   Cardiovascular: Negative for chest pain and palpitations.  Gastrointestinal: Negative for abdominal pain, constipation, diarrhea, nausea and vomiting.  Genitourinary: Negative for dysuria and frequency.  Musculoskeletal: Negative for arthralgias, back pain, joint swelling and neck pain.  Skin: Negative for rash.  Neurological: Negative for tremors and numbness.  Hematological: Negative for adenopathy. Does not bruise/bleed easily.  Psychiatric/Behavioral: Negative for behavioral problems (Depression), sleep disturbance and suicidal ideas. The patient  is not nervous/anxious.     Vital Signs: BP 112/70   Pulse 67   Temp (!) 97.4 F (36.3 C)   Resp 16   Ht 6' (1.829 m)   Wt 192 lb (87.1 kg)   SpO2 98%   BMI 26.04 kg/m    Physical Exam Vitals reviewed.  Constitutional:      Appearance: Normal appearance. He is normal weight.  Cardiovascular:     Rate and  Rhythm: Normal rate and regular rhythm.     Pulses: Normal pulses.     Heart sounds: Normal heart sounds.  Pulmonary:     Effort: Pulmonary effort is normal.     Breath sounds: Normal breath sounds.  Musculoskeletal:        General: Normal range of motion.     Cervical back: Normal range of motion.  Skin:    General: Skin is warm.  Neurological:     General: No focal deficit present.     Mental Status: He is alert and oriented to person, place, and time. Mental status is at baseline.  Psychiatric:        Mood and Affect: Mood normal.        Behavior: Behavior normal.        Thought Content: Thought content normal.        Judgment: Judgment normal.    Assessment/Plan: 1. Dilated aortic root (HCC) Repeat echocardiogram annually for monitoring Stressed importance of blood pressure control  2. Chronic midline low back pain with bilateral sciatica Back pain controlled at this time, has appointment with pain management next month to discuss alternative treatment options  General Counseling: Sigifredo verbalizes understanding of the findings of todays visit and agrees with plan of treatment. I have discussed any further diagnostic evaluation that may be needed or ordered today. We also reviewed his medications today. he has been encouraged to call the office with any questions or concerns that should arise related to todays visit.   Time spent: 30 Minutes Time spent includes review of chart, medications, test results and follow-up plan with the patient.  This patient was seen by Leeanne Deed AGNP-C in Collaboration with Dr Lyndon Code as a part of collaborative care agreement     Lubertha Basque. Lunabelle Oatley AGNP-C Internal medicine

## 2020-12-04 ENCOUNTER — Encounter: Payer: Self-pay | Admitting: Hospice and Palliative Medicine

## 2021-01-03 ENCOUNTER — Encounter: Payer: Self-pay | Admitting: Nurse Practitioner

## 2021-01-06 NOTE — Telephone Encounter (Signed)
Let's discuss management

## 2021-01-23 ENCOUNTER — Encounter: Payer: BC Managed Care – PPO | Admitting: Nurse Practitioner

## 2021-01-28 ENCOUNTER — Telehealth: Payer: Self-pay

## 2021-01-28 NOTE — Telephone Encounter (Signed)
Left vm to screen for 01/29/21 appointment-Toni 

## 2021-01-29 ENCOUNTER — Ambulatory Visit (INDEPENDENT_AMBULATORY_CARE_PROVIDER_SITE_OTHER): Payer: BC Managed Care – PPO | Admitting: Nurse Practitioner

## 2021-01-29 ENCOUNTER — Other Ambulatory Visit: Payer: Self-pay

## 2021-01-29 ENCOUNTER — Encounter: Payer: Self-pay | Admitting: Nurse Practitioner

## 2021-01-29 VITALS — BP 125/71 | HR 70 | Temp 98.3°F | Resp 16 | Ht 72.0 in | Wt 190.0 lb

## 2021-01-29 DIAGNOSIS — Z0001 Encounter for general adult medical examination with abnormal findings: Secondary | ICD-10-CM

## 2021-01-29 DIAGNOSIS — E782 Mixed hyperlipidemia: Secondary | ICD-10-CM

## 2021-01-29 DIAGNOSIS — Z0189 Encounter for other specified special examinations: Secondary | ICD-10-CM

## 2021-01-29 DIAGNOSIS — R5383 Other fatigue: Secondary | ICD-10-CM | POA: Diagnosis not present

## 2021-01-29 DIAGNOSIS — M5442 Lumbago with sciatica, left side: Secondary | ICD-10-CM

## 2021-01-29 DIAGNOSIS — R300 Dysuria: Secondary | ICD-10-CM

## 2021-01-29 DIAGNOSIS — E559 Vitamin D deficiency, unspecified: Secondary | ICD-10-CM

## 2021-01-29 DIAGNOSIS — G8929 Other chronic pain: Secondary | ICD-10-CM

## 2021-01-29 DIAGNOSIS — M5441 Lumbago with sciatica, right side: Secondary | ICD-10-CM

## 2021-01-29 DIAGNOSIS — W57XXXA Bitten or stung by nonvenomous insect and other nonvenomous arthropods, initial encounter: Secondary | ICD-10-CM

## 2021-01-29 MED ORDER — BACLOFEN 10 MG PO TABS: 10.0000 mg | ORAL_TABLET | Freq: Three times a day (TID) | ORAL | 0 refills | Status: DC

## 2021-01-29 MED ORDER — DOXYCYCLINE HYCLATE 100 MG PO TABS: 100.0000 mg | ORAL_TABLET | Freq: Two times a day (BID) | ORAL | 0 refills | Status: DC

## 2021-01-29 MED ORDER — CARISOPRODOL 350 MG PO TABS: 350.0000 mg | ORAL_TABLET | Freq: Every day | ORAL | 0 refills | Status: DC | PRN

## 2021-01-29 MED ORDER — CYCLOBENZAPRINE HCL 10 MG PO TABS: 10.0000 mg | ORAL_TABLET | Freq: Every day | ORAL | 1 refills | Status: DC

## 2021-01-29 NOTE — Progress Notes (Signed)
[facility] [address] [place], [place] [address]  Internal MEDICINE  Office Visit Note  Patient Name: Vincent MantleSpencer Huffman  [id number]  [id number]  Date of Service: 01/29/2021  Chief Complaint  Patient presents with   Annual Exam    Med adjustment    HPI Karleen HampshireSpencer presents for an annual well visit and physical exam. he has a history of arthritis and GERD, degenerative joint disease of the lumbar spine, chronic reflux esophagitis, tonsillectomy and adenoidectomy. He has received 2 doses of the COVID vaccine and 1 booster dose. He is not due for any other age appropriate screening.  He lives alone and works as a Runner, broadcasting/film/videoteacher at a middle school . -He had an old silver filling break off recently and he swallowed it. He is now worried that he may have heavy metal poisoning. He does not present with any concerning symptoms.  -has a couple of scattered areas of raised red itchy skin that look like bug bites and one on his upper right back that looks the same as well     Check for lyme disease? At next visit     Current Medication: Outpatient Encounter Medications as of 01/29/2021  Medication Sig   baclofen (LIORESAL) 10 MG tablet Take 1 tablet (10 mg total) by mouth 3 (three) times daily.   carisoprodol (SOMA) 350 MG tablet Take 1 tablet (350 mg total) by mouth daily as needed for muscle spasms.   cyclobenzaprine (FLEXERIL) 10 MG tablet Take 1 tablet (10 mg total) by mouth at bedtime. Take one tab po qhs for back spasm prn only   doxycycline (VIBRA-TABS) 100 MG tablet Take 1 tablet (100 mg total) by mouth 2 (two) times daily. With food   [DISCONTINUED] tiZANidine (ZANAFLEX) 4 MG tablet Take 1 tablet (4 mg total) by mouth every 6 (six) hours as needed for muscle spasms.   [DISCONTINUED] ranitidine (ZANTAC) 150 MG capsule Take 1 capsule (150 mg total) by mouth 2 (two) times daily.   No facility-administered encounter medications on file as of 01/29/2021.    Surgical History: Past  Surgical History:  Procedure Laterality Date   ADENOIDECTOMY     ESOPHAGOGASTRODUODENOSCOPY N/A 02/28/2020   Procedure: ESOPHAGOGASTRODUODENOSCOPY (EGD);  Surgeon: Toledo, Boykin Nearingeodoro K, MD;  Location: [facility] ENDOSCOPY;  Service: Gastroenterology;  Laterality: N/A;   TONSILLECTOMY      Medical History: Past Medical History:  Diagnosis Date   Chronic reflux esophagitis    Degenerative joint disease (DJD) of lumbar spine    Esophagitis    GERD (gastroesophageal reflux disease)     Family History: Family History  Problem Relation Age of Onset   Heart disease Mother    Heart disease Father    Diabetes Maternal Grandfather     Social History   Socioeconomic History   Marital status: Single    Spouse name: Not on file   Number of children: Not on file   Years of education: Not on file   Highest education level: Not on file  Occupational History   Occupation: Working Personnel officerlectrician   Tobacco Use   Smoking status: Never   Smokeless tobacco: Never  Vaping Use   Vaping Use: Never used  Substance and Sexual Activity   Alcohol use: No   Drug use: No   Sexual activity: Not on file  Other Topics Concern   Not on file  Social History Narrative   Not on file   Social Determinants of Health   Financial Resource Strain: Not on file  Food Insecurity: Not on file  Transportation Needs: Not on file  Physical Activity: Not on file  Stress: Not on file  Social Connections: Not on file  Intimate Partner Violence: Not on file      Review of Systems  Constitutional:  Negative for activity change, appetite change, chills, fatigue, fever and unexpected weight change.  HENT: Negative.  Negative for congestion, ear pain, rhinorrhea, sneezing, sore throat and trouble swallowing.   Eyes: Negative.  Negative for redness.  Respiratory: Negative.  Negative for cough, chest tightness, shortness of breath and wheezing.   Cardiovascular: Negative.  Negative for chest pain and palpitations.   Gastrointestinal: Negative.  Negative for abdominal pain, blood in stool, constipation, diarrhea, nausea and vomiting.  Endocrine: Negative.   Genitourinary: Negative.  Negative for difficulty urinating, dysuria, frequency, hematuria and urgency.  Musculoskeletal: Negative.  Negative for arthralgias, back pain, joint swelling, myalgias and neck pain.  Skin: Negative.  Negative for rash and wound.  Allergic/Immunologic: Negative.  Negative for immunocompromised state.  Neurological: Negative.  Negative for dizziness, tremors, seizures, numbness and headaches.  Hematological: Negative.  Negative for adenopathy. Does not bruise/bleed easily.  Psychiatric/Behavioral: Negative.  Negative for behavioral problems (Depression), self-injury, sleep disturbance and suicidal ideas. The patient is not nervous/anxious.    Vital Signs: BP 125/71   Pulse 70   Temp 98.3 F (36.8 C)   Resp 16   Ht 6' (1.829 m)   Wt 190 lb (86.2 kg)   SpO2 95%   BMI 25.77 kg/m    Physical Exam Vitals reviewed.  Constitutional:      General: He is not in acute distress.    Appearance: He is not ill-appearing.  HENT:     Head: Normocephalic and atraumatic.     Right Ear: Tympanic membrane, ear canal and external ear normal.     Left Ear: Tympanic membrane, ear canal and external ear normal.     Nose: Nose normal. No congestion.     Mouth/Throat:     Mouth: Mucous membranes are moist.     Pharynx: Oropharynx is clear. No posterior oropharyngeal erythema.  Eyes:     Extraocular Movements: Extraocular movements intact.     Conjunctiva/sclera: Conjunctivae normal.     Pupils: Pupils are equal, round, and reactive to light.  Cardiovascular:     Rate and Rhythm: Normal rate and regular rhythm.     Pulses: Normal pulses.     Heart sounds: Normal heart sounds. No murmur heard. Pulmonary:     Effort: Pulmonary effort is normal. No respiratory distress.     Breath sounds: Normal breath sounds.  Abdominal:      General: Bowel sounds are normal. There is no distension.     Palpations: Abdomen is soft. There is no mass.     Tenderness: There is no abdominal tenderness. There is no guarding or rebound.     Hernia: No hernia is present.  Musculoskeletal:        General: Normal range of motion.     Cervical back: Normal range of motion and neck supple.  Lymphadenopathy:     Cervical: No cervical adenopathy.  Skin:    General: Skin is warm and dry.     Capillary Refill: Capillary refill takes less than 2 seconds.  Neurological:     Mental Status: He is alert and oriented to person, place, and time.  Psychiatric:        Mood and Affect: Mood normal.  Behavior: Behavior normal.        Thought Content: Thought content normal.        Judgment: Judgment normal.       Assessment/Plan: 1. Encounter for routine adult health examination with abnormal findings Age-appropriate preventive screenings discussed, annual physical exam completed. No age appropriate screenings due.   2. Encounter for routine laboratory testing Routine labs ordered for health maintenance.   3. Bug bite, initial encounter Empirically treating for lyme disease. Patient is unsure if any of the spots had a tick. Will test for lyme disease in a few weeks.  - doxycycline (VIBRA-TABS) 100 MG tablet; Take 1 tablet (100 mg total) by mouth 2 (two) times daily. With food  Dispense: 20 tablet; Refill: 0  4. Chronic midline low back pain with bilateral sciatica Medications changed and adjusted per patient request.  - baclofen (LIORESAL) 10 MG tablet; Take 1 tablet (10 mg total) by mouth 3 (three) times daily.  Dispense: 30 each; Refill: 0 - cyclobenzaprine (FLEXERIL) 10 MG tablet; Take 1 tablet (10 mg total) by mouth at bedtime. Take one tab po qhs for back spasm prn only  Dispense: 30 tablet; Refill: 1 - carisoprodol (SOMA) 350 MG tablet; Take 1 tablet (350 mg total) by mouth daily as needed for muscle spasms.  Dispense: 30 tablet;  Refill: 0  5. Other fatigue Rule out heavy metal poisoning related to silver filling that the patient found, rule out hypothyroidism.  - Heavy Metals, Blood - Testosterone,Free and Total - TSH + free T4  6. Mixed hyperlipidemia History of hyperlipidemia, recheck lipid panel. - Lipid Profile  7. Vitamin D deficiency Recheck vitamin D level - Vitamin D (25 hydroxy)  8. Dysuria Routine urinalysis done.  - UA/M w/rflx Culture, Routine - Microscopic Examination      General Counseling: Giavanni verbalizes understanding of the findings of todays visit and agrees with plan of treatment. I have discussed any further diagnostic evaluation that may be needed or ordered today. We also reviewed his medications today. he has been encouraged to call the office with any questions or concerns that should arise related to todays visit.    Orders Placed This Encounter  Procedures   Microscopic Examination   UA/M w/rflx Culture, Routine   Heavy Metals, Blood   Lipid Profile   Testosterone,Free and Total   TSH + free T4   Vitamin D (25 hydroxy)    Meds ordered this encounter  Medications   baclofen (LIORESAL) 10 MG tablet    Sig: Take 1 tablet (10 mg total) by mouth 3 (three) times daily.    Dispense:  30 each    Refill:  0   cyclobenzaprine (FLEXERIL) 10 MG tablet    Sig: Take 1 tablet (10 mg total) by mouth at bedtime. Take one tab po qhs for back spasm prn only    Dispense:  30 tablet    Refill:  1   carisoprodol (SOMA) 350 MG tablet    Sig: Take 1 tablet (350 mg total) by mouth daily as needed for muscle spasms.    Dispense:  30 tablet    Refill:  0   doxycycline (VIBRA-TABS) 100 MG tablet    Sig: Take 1 tablet (100 mg total) by mouth 2 (two) times daily. With food    Dispense:  20 tablet    Refill:  0    Return in about 6 weeks (around 03/12/2021) for F/U, pain med refill, Dylen Mcelhannon PCP.   Total time spent:30 Minutes  Time spent includes review of chart, medications, test  results, and follow up plan with the patient.   [place] Controlled Substance Database was reviewed by me.  This patient was seen by Sallyanne Kuster, FNP-C in collaboration with Dr. Beverely Risen as a part of collaborative care agreement.  Ida Milbrath R. Tedd Sias, MSN, FNP-C Internal medicine

## 2021-01-30 LAB — UA/M W/RFLX CULTURE, ROUTINE
Bilirubin, UA: NEGATIVE
Glucose, UA: NEGATIVE
Leukocytes,UA: NEGATIVE
Nitrite, UA: NEGATIVE
Protein,UA: NEGATIVE
RBC, UA: NEGATIVE
Specific Gravity, UA: 1.026 (ref 1.005–1.030)
Urobilinogen, Ur: 0.2 mg/dL (ref 0.2–1.0)
pH, UA: 6 (ref 5.0–7.5)

## 2021-01-30 LAB — MICROSCOPIC EXAMINATION
Bacteria, UA: NONE SEEN
Casts: NONE SEEN /lpf
Epithelial Cells (non renal): NONE SEEN /hpf (ref 0–10)
WBC, UA: NONE SEEN /hpf (ref 0–5)

## 2021-02-08 LAB — HEAVY METALS, BLOOD

## 2021-02-08 LAB — TESTOSTERONE,FREE AND TOTAL
Testosterone, Free: 14 pg/mL (ref 6.8–21.5)
Testosterone: 656 ng/dL (ref 264–916)

## 2021-02-08 LAB — LIPID PANEL
Chol/HDL Ratio: 3.5 ratio (ref 0.0–5.0)
Cholesterol, Total: 146 mg/dL (ref 100–199)
HDL: 42 mg/dL (ref >39)
LDL Chol Calc (NIH): 92 mg/dL (ref 0–99)
Triglycerides: 57 mg/dL (ref 0–149)
VLDL Cholesterol Cal: 12 mg/dL (ref 5–40)

## 2021-02-08 LAB — TSH+FREE T4
Free T4: 1.12 ng/dL (ref 0.82–1.77)
TSH: 2.07 u[IU]/mL (ref 0.450–4.500)

## 2021-02-08 LAB — VITAMIN D 25 HYDROXY (VIT D DEFICIENCY, FRACTURES): Vit D, 25-Hydroxy: 31.4 ng/mL (ref 30.0–100.0)

## 2021-02-28 ENCOUNTER — Telehealth: Payer: Self-pay

## 2021-02-28 NOTE — Telephone Encounter (Signed)
Error

## 2021-03-03 ENCOUNTER — Other Ambulatory Visit: Payer: Self-pay

## 2021-03-03 ENCOUNTER — Encounter: Payer: Self-pay | Admitting: Nurse Practitioner

## 2021-03-03 ENCOUNTER — Ambulatory Visit: Payer: BC Managed Care – PPO | Admitting: Nurse Practitioner

## 2021-03-03 VITALS — BP 118/70 | HR 85 | Temp 97.3°F | Resp 16 | Ht 72.0 in | Wt 184.4 lb

## 2021-03-03 DIAGNOSIS — E559 Vitamin D deficiency, unspecified: Secondary | ICD-10-CM

## 2021-03-03 DIAGNOSIS — M5442 Lumbago with sciatica, left side: Secondary | ICD-10-CM

## 2021-03-03 DIAGNOSIS — E870 Hyperosmolality and hypernatremia: Secondary | ICD-10-CM

## 2021-03-03 DIAGNOSIS — M5441 Lumbago with sciatica, right side: Secondary | ICD-10-CM

## 2021-03-03 DIAGNOSIS — G8929 Other chronic pain: Secondary | ICD-10-CM

## 2021-03-03 DIAGNOSIS — E782 Mixed hyperlipidemia: Secondary | ICD-10-CM

## 2021-03-03 NOTE — Progress Notes (Signed)
[facility], [place] [address]  Internal MEDICINE  Office Visit Note  Patient Name: Vincent Huffman  [id number]  [id number]  Date of Service: 03/03/2021  Chief Complaint  Patient presents with   Follow-up    Feels off   Gastroesophageal Reflux    HPI Vincent Huffman presents for a follow up visit. He did have increased back pain a few weeks ago. He took some of his prescribed medications - cyclobenzaprine, baclofen and carisoprodol for the increased pain. This has since calmed down and he is not taking any of these medications right now. He reports that he only takes them when he needs them.  Discussed labs with patient, his last office visit was 01/29/21 for his annual physical exam and his labs were ordered then. His vitamin D, thyroid levels, testosterone, CBC and lipid profile were normal. His sodium was slightly elevated at 145. Patient was informed, encouraged to drink more water.  His lipid profile was normal but his LDL has been elevated in the past and his HDL has been low in the past. When asking what he could do to keep his levels normal, he was encouraged to increase physical activity if possible and that a fish oil supplement with 1000 mg daily may be helpful.     Current Medication: Outpatient Encounter Medications as of 03/03/2021  Medication Sig   baclofen (LIORESAL) 10 MG tablet Take 1 tablet (10 mg total) by mouth 3 (three) times daily.   carisoprodol (SOMA) 350 MG tablet Take 1 tablet (350 mg total) by mouth daily as needed for muscle spasms.   cyclobenzaprine (FLEXERIL) 10 MG tablet Take 1 tablet (10 mg total) by mouth at bedtime. Take one tab po qhs for back spasm prn only   doxycycline (VIBRA-TABS) 100 MG tablet Take 1 tablet (100 mg total) by mouth 2 (two) times daily. With food   [DISCONTINUED] ranitidine (ZANTAC) 150 MG capsule Take 1 capsule (150 mg total) by mouth 2 (two) times daily.   No facility-administered encounter medications on  file as of 03/03/2021.    Surgical History: Past Surgical History:  Procedure Laterality Date   ADENOIDECTOMY     ESOPHAGOGASTRODUODENOSCOPY N/A 02/28/2020   Procedure: ESOPHAGOGASTRODUODENOSCOPY (EGD);  Surgeon: Toledo, Benay Pike, MD;  Location: [facility] ENDOSCOPY;  Service: Gastroenterology;  Laterality: N/A;   TONSILLECTOMY      Medical History: Past Medical History:  Diagnosis Date   Chronic reflux esophagitis    Degenerative joint disease (DJD) of lumbar spine    Esophagitis    GERD (gastroesophageal reflux disease)     Family History: Family History  Problem Relation Age of Onset   Heart disease Mother    Heart disease Father    Diabetes Maternal Grandfather     Social History   Socioeconomic History   Marital status: Single    Spouse name: Not on file   Number of children: Not on file   Years of education: Not on file   Highest education level: Not on file  Occupational History   Occupation: Working Clinical biochemist   Tobacco Use   Smoking status: Never   Smokeless tobacco: Never  Vaping Use   Vaping Use: Never used  Substance and Sexual Activity   Alcohol use: No   Drug use: No   Sexual activity: Not on file  Other Topics Concern   Not on file  Social History Narrative   Not on file   Social Determinants of Health   Financial Resource Strain:  Not on file  Food Insecurity: Not on file  Transportation Needs: Not on file  Physical Activity: Not on file  Stress: Not on file  Social Connections: Not on file  Intimate Partner Violence: Not on file      Review of Systems  Constitutional:  Negative for chills, fatigue and unexpected weight change.  HENT:  Negative for congestion, rhinorrhea, sneezing and sore throat.   Eyes:  Negative for redness.  Respiratory:  Negative for cough, chest tightness and shortness of breath.   Cardiovascular:  Negative for chest pain and palpitations.  Gastrointestinal:  Negative for abdominal pain, constipation, diarrhea,  nausea and vomiting.  Genitourinary:  Negative for dysuria and frequency.  Musculoskeletal:  Negative for arthralgias, back pain, joint swelling and neck pain.  Skin:  Negative for rash.  Neurological: Negative.  Negative for tremors and numbness.  Hematological:  Negative for adenopathy. Does not bruise/bleed easily.  Psychiatric/Behavioral:  Negative for behavioral problems (Depression), sleep disturbance and suicidal ideas. The patient is not nervous/anxious.    Vital Signs: BP 118/70   Pulse 85   Temp (!) 97.3 F (36.3 C)   Resp 16   Ht 6' (1.829 m)   Wt 184 lb 6.4 oz (83.6 kg)   SpO2 98%   BMI 25.01 kg/m    Physical Exam Vitals reviewed.  Constitutional:      General: He is not in acute distress.    Appearance: Normal appearance. He is not ill-appearing.  HENT:     Head: Normocephalic and atraumatic.  Cardiovascular:     Rate and Rhythm: Normal rate and regular rhythm.  Pulmonary:     Effort: Pulmonary effort is normal. No respiratory distress.  Skin:    General: Skin is warm and dry.     Capillary Refill: Capillary refill takes less than 2 seconds.  Neurological:     Mental Status: He is alert and oriented to person, place, and time.  Psychiatric:        Mood and Affect: Mood normal.        Behavior: Behavior normal.    Assessment/Plan: 1. Hypernatremia Elevated sodium level, recheck CMP to ensure resolution. - CMP14+EGFR  2. Chronic midline low back pain with bilateral sciatica Chronic, not bothering him now, he is not taking any medication for it unless he needs it.   3. Vitamin D deficiency Resolved, vitamin D level is wnl, may still take OTC supplement if the patient desires.   4. Mixed hyperlipidemia Levels are currently stable and wnl. Continue current diet and lifestyle modifications, patient is not currently taking any medications for cholesterol.   General Counseling: [name] verbalizes understanding of the findings of todays visit and agrees  with plan of treatment. I have discussed any further diagnostic evaluation that may be needed or ordered today. We also reviewed his medications today. he has been encouraged to call the office with any questions or concerns that should arise related to todays visit.    Orders Placed This Encounter  Procedures   CMP14+EGFR    No orders of the defined types were placed in this encounter.   Return in about 5 months (around 08/03/2021) for F/U, med refill, Tea Collums PCP.   Total time spent:30 Minutes Time spent includes review of chart, medications, test results, and follow up plan with the patient.   [place] Controlled Substance Database was reviewed by me.  This patient was seen by Jonetta Osgood, FNP-C in collaboration with Dr. Clayborn Bigness as a part of collaborative  care agreement.   Mico Spark R. Valetta Fuller, MSN, FNP-C Internal medicine

## 2021-04-06 ENCOUNTER — Telehealth: Payer: BC Managed Care – PPO | Admitting: Family

## 2021-04-06 DIAGNOSIS — M545 Low back pain, unspecified: Secondary | ICD-10-CM | POA: Diagnosis not present

## 2021-04-06 MED ORDER — TIZANIDINE HCL 2 MG PO CAPS: 2.0000 mg | ORAL_CAPSULE | Freq: Three times a day (TID) | ORAL | 0 refills | Status: DC

## 2021-04-06 MED ORDER — NAPROXEN 500 MG PO TABS: 500.0000 mg | ORAL_TABLET | Freq: Two times a day (BID) | ORAL | 0 refills | Status: DC

## 2021-04-06 NOTE — Progress Notes (Signed)

## 2021-05-27 ENCOUNTER — Other Ambulatory Visit: Payer: Self-pay | Admitting: Family

## 2021-05-27 DIAGNOSIS — M545 Low back pain, unspecified: Secondary | ICD-10-CM

## 2021-07-08 ENCOUNTER — Ambulatory Visit (INDEPENDENT_AMBULATORY_CARE_PROVIDER_SITE_OTHER): Payer: BC Managed Care – PPO | Admitting: Nurse Practitioner

## 2021-07-08 ENCOUNTER — Other Ambulatory Visit: Payer: Self-pay

## 2021-07-08 ENCOUNTER — Encounter: Payer: Self-pay | Admitting: Nurse Practitioner

## 2021-07-08 VITALS — BP 117/84 | HR 76 | Temp 98.3°F | Resp 16 | Ht 72.0 in | Wt 196.4 lb

## 2021-07-08 DIAGNOSIS — R5383 Other fatigue: Secondary | ICD-10-CM | POA: Diagnosis not present

## 2021-07-08 DIAGNOSIS — Z1159 Encounter for screening for other viral diseases: Secondary | ICD-10-CM | POA: Diagnosis not present

## 2021-07-08 DIAGNOSIS — E538 Deficiency of other specified B group vitamins: Secondary | ICD-10-CM

## 2021-07-08 DIAGNOSIS — M064 Inflammatory polyarthropathy: Secondary | ICD-10-CM | POA: Diagnosis not present

## 2021-07-08 DIAGNOSIS — E559 Vitamin D deficiency, unspecified: Secondary | ICD-10-CM | POA: Diagnosis not present

## 2021-07-08 NOTE — Progress Notes (Signed)
[facility] [place], [place] [address]  Internal MEDICINE  Office Visit Note  Patient Name: Vincent Huffman  [id number]  [id number]  Date of Service: 07/08/2021  Chief Complaint  Patient presents with   Follow-up    Back pain starting up, low energy and motivation   Gastroesophageal Reflux   Fatigue    HPI Vincent Huffman presents for a follow up visit for general fatigue and low energy. He also has chronic back pain and is having a flare up. He has had physical therapy before and knows what stretches he needs to do.  He has had low energy for 2 months, poor sleep that is not restful and fatigue.  He had a URI 2 weeks ago with cough, green sputum, sinus pressure, headache and nasal congestion. He denies having any GI symptoms. The URI symptoms have resolved.  He is worried he could have multiple myeloma which runs in his family. He also wants to have the heavy metals lab reordered since it did not get done right last time.  Has gained 12 lbs since his previous office visit.    Current Medication: Outpatient Encounter Medications as of 07/08/2021  Medication Sig   baclofen (LIORESAL) 10 MG tablet Take 1 tablet (10 mg total) by mouth 3 (three) times daily.   carisoprodol (SOMA) 350 MG tablet Take 1 tablet (350 mg total) by mouth daily as needed for muscle spasms.   cyclobenzaprine (FLEXERIL) 10 MG tablet Take 1 tablet (10 mg total) by mouth at bedtime. Take one tab po qhs for back spasm prn only   naproxen (NAPROSYN) 500 MG tablet Take 1 tablet (500 mg total) by mouth 2 (two) times daily with a meal.   tizanidine (ZANAFLEX) 2 MG capsule Take 1 capsule (2 mg total) by mouth 3 (three) times daily.   [DISCONTINUED] doxycycline (VIBRA-TABS) 100 MG tablet Take 1 tablet (100 mg total) by mouth 2 (two) times daily. With food   [DISCONTINUED] ranitidine (ZANTAC) 150 MG capsule Take 1 capsule (150 mg total) by mouth 2 (two) times daily.   No facility-administered encounter  medications on file as of 07/08/2021.    Surgical History: Past Surgical History:  Procedure Laterality Date   ADENOIDECTOMY     ESOPHAGOGASTRODUODENOSCOPY N/A 02/28/2020   Procedure: ESOPHAGOGASTRODUODENOSCOPY (EGD);  Surgeon: Toledo, Benay Pike, MD;  Location: [facility] ENDOSCOPY;  Service: Gastroenterology;  Laterality: N/A;   TONSILLECTOMY      Medical History: Past Medical History:  Diagnosis Date   Chronic reflux esophagitis    Degenerative joint disease (DJD) of lumbar spine    Esophagitis    GERD (gastroesophageal reflux disease)     Family History: Family History  Problem Relation Age of Onset   Heart disease Mother    Heart disease Father    Diabetes Maternal Grandfather     Social History   Socioeconomic History   Marital status: Single    Spouse name: Not on file   Number of children: Not on file   Years of education: Not on file   Highest education level: Not on file  Occupational History   Occupation: Working Clinical biochemist   Tobacco Use   Smoking status: Never   Smokeless tobacco: Never  Vaping Use   Vaping Use: Never used  Substance and Sexual Activity   Alcohol use: No   Drug use: No   Sexual activity: Not on file  Other Topics Concern   Not on file  Social History Narrative   Not on  file   Social Determinants of Health   Financial Resource Strain: Not on file  Food Insecurity: Not on file  Transportation Needs: Not on file  Physical Activity: Not on file  Stress: Not on file  Social Connections: Not on file  Intimate Partner Violence: Not on file      Review of Systems  Constitutional:  Positive for activity change (low energy) and fatigue. Negative for chills and unexpected weight change.  HENT:  Negative for congestion, rhinorrhea, sneezing and sore throat.   Eyes:  Negative for redness.  Respiratory:  Negative for cough, chest tightness and shortness of breath.   Cardiovascular:  Negative for chest pain and palpitations.   Gastrointestinal:  Negative for abdominal pain, constipation, diarrhea, nausea and vomiting.  Genitourinary:  Negative for dysuria and frequency.  Musculoskeletal:  Negative for arthralgias, back pain, joint swelling and neck pain.  Skin:  Negative for rash.  Neurological: Negative.  Negative for tremors and numbness.  Hematological:  Negative for adenopathy. Does not bruise/bleed easily.  Psychiatric/Behavioral:  Positive for dysphoric mood. Negative for behavioral problems (Depression), self-injury, sleep disturbance and suicidal ideas. The patient is not nervous/anxious.        Low energy possible anhedonia.    Vital Signs: BP 117/84   Pulse 76   Temp 98.3 F (36.8 C)   Resp 16   Ht 6' (1.829 m)   Wt 196 lb 6.4 oz (89.1 kg)   SpO2 99%   BMI 26.64 kg/m    Physical Exam Vitals reviewed.  Constitutional:      General: He is not in acute distress.    Appearance: Normal appearance. He is not ill-appearing.  HENT:     Head: Normocephalic and atraumatic.  Eyes:     Pupils: Pupils are equal, round, and reactive to light.  Cardiovascular:     Rate and Rhythm: Normal rate and regular rhythm.  Pulmonary:     Effort: Pulmonary effort is normal. No respiratory distress.  Neurological:     Mental Status: He is alert and oriented to person, place, and time.     Cranial Nerves: No cranial nerve deficit.     Coordination: Coordination normal.     Gait: Gait normal.  Psychiatric:        Mood and Affect: Mood normal.        Behavior: Behavior normal.       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  - B12 and Folate Panel - Vitamin D (25 hydroxy) - CBC with Differential/Platelet - Heavy metals, blood - Rheumatoid Factor - ANA Direct w/Reflex if Positive - Sed Rate (ESR) - C-reactive protein - Basic Metabolic Panel (BMET) - Basic  metabolic panel - Interpretation:  2. Other fatigue Rule out possible causes of fatigue. Rule out anemia, vitamin deficiencies, heavy metal poisoning per patient request and electrolyte imbalance.  - B12 and Folate Panel - Vitamin D (25 hydroxy) - CBC with Differential/Platelet - Heavy metals, blood - Basic Metabolic Panel (BMET) - Basic metabolic panel - Interpretation:  3. Encounter for hepatitis C screening test for low risk patient Patient requesting testing for hep C - HCV Ab w Reflex to Quant PCR  4. Vitamin D deficiency Rule out low vitamin D - Vitamin D (25 hydroxy)  5. B12 deficiency Rule out b12 def - B12 and Folate Panel   General Counseling: Estell verbalizes understanding of the findings of  todays visit and agrees with plan of treatment. I have discussed any further diagnostic evaluation that may be needed or ordered today. We also reviewed his medications today. he has been encouraged to call the office with any questions or concerns that should arise related to todays visit.    Orders Placed This Encounter  Procedures   B12 and Folate Panel   Vitamin D (25 hydroxy)   CBC with Differential/Platelet   Heavy metals, blood   HCV Ab w Reflex to Quant PCR   Rheumatoid Factor   ANA Direct w/Reflex if Positive   Sed Rate (ESR)   C-reactive protein   Basic Metabolic Panel (BMET)   Basic metabolic panel   Interpretation:    No orders of the defined types were placed in this encounter.   Return in about 2 months (around 09/08/2021) for F/U, Review labs/test, Mont Jagoda PCP.   Total time spent:30 Minutes Time spent includes review of chart, medications, test results, and follow up plan with the patient.   [place] Controlled Substance Database was reviewed by me.  This patient was seen by Jonetta Osgood, FNP-C in collaboration with Dr. Clayborn Bigness as a part of collaborative care agreement.   Jeronica Stlouis R. Valetta Fuller, MSN, FNP-C Internal medicine

## 2021-07-10 LAB — CBC WITH DIFFERENTIAL/PLATELET
Basophils Absolute: 0 10*3/uL (ref 0.0–0.2)
Basos: 1 %
EOS (ABSOLUTE): 0.1 10*3/uL (ref 0.0–0.4)
Eos: 2 %
Hematocrit: 41.9 % (ref 37.5–51.0)
Hemoglobin: 14.9 g/dL (ref 13.0–17.7)
Immature Grans (Abs): 0 10*3/uL (ref 0.0–0.1)
Immature Granulocytes: 0 %
Lymphocytes Absolute: 1.5 10*3/uL (ref 0.7–3.1)
Lymphs: 24 %
MCH: 31 pg (ref 26.6–33.0)
MCHC: 35.6 g/dL (ref 31.5–35.7)
MCV: 87 fL (ref 79–97)
Monocytes Absolute: 0.4 10*3/uL (ref 0.1–0.9)
Monocytes: 6 %
Neutrophils Absolute: 4 10*3/uL (ref 1.4–7.0)
Neutrophils: 67 %
Platelets: 220 10*3/uL (ref 150–450)
RBC: 4.81 x10E6/uL (ref 4.14–5.80)
RDW: 11.8 % (ref 11.6–15.4)
WBC: 6.1 10*3/uL (ref 3.4–10.8)

## 2021-07-10 LAB — HCV AB W REFLEX TO QUANT PCR: HCV Ab: <0.1 s/co ratio (ref 0.0–0.9)

## 2021-07-10 LAB — C-REACTIVE PROTEIN: CRP: 2 mg/L (ref 0–10)

## 2021-07-10 LAB — BASIC METABOLIC PANEL
BUN/Creatinine Ratio: 15 (ref 9–20)
BUN: 12 mg/dL (ref 6–24)
CO2: 24 mmol/L (ref 20–29)
Calcium: 9.4 mg/dL (ref 8.7–10.2)
Chloride: 98 mmol/L (ref 96–106)
Creatinine, Ser: 0.82 mg/dL (ref 0.76–1.27)
Glucose: 69 mg/dL — ABNORMAL LOW (ref 70–99)
Potassium: 4.3 mmol/L (ref 3.5–5.2)
Sodium: 138 mmol/L (ref 134–144)
eGFR: 111 mL/min/{1.73_m2} (ref >59)

## 2021-07-10 LAB — B12 AND FOLATE PANEL
Folate: >20 ng/mL (ref >3.0)
Vitamin B-12: 1030 pg/mL (ref 232–1245)

## 2021-07-10 LAB — VITAMIN D 25 HYDROXY (VIT D DEFICIENCY, FRACTURES): Vit D, 25-Hydroxy: 30.8 ng/mL (ref 30.0–100.0)

## 2021-07-10 LAB — HEAVY METALS, BLOOD
Arsenic: 1 ug/L (ref 0–9)
Lead, Blood: <1 ug/dL (ref 0.0–3.4)
Mercury: 1 ug/L (ref 0.0–14.9)

## 2021-07-10 LAB — SEDIMENTATION RATE: Sed Rate: 2 mm/hr (ref 0–15)

## 2021-07-10 LAB — HCV INTERPRETATION

## 2021-07-10 LAB — RHEUMATOID FACTOR: Rheumatoid fact SerPl-aCnc: <10 IU/mL (ref <14.0)

## 2021-07-10 LAB — ANA W/REFLEX IF POSITIVE: Anti Nuclear Antibody (ANA): NEGATIVE

## 2021-07-16 ENCOUNTER — Telehealth: Payer: Self-pay

## 2021-07-16 NOTE — Progress Notes (Signed)
LMOM pt needs to know labs are normal, vitamin D is a low normal and pt can take OTC supplements if desired and that blood glucose is slightly low, just one point below normal, and not clinically significant at this time.

## 2021-07-16 NOTE — Telephone Encounter (Signed)
-----   Message from Sallyanne Kuster, NP sent at 07/16/2021  6:05 AM EST ----- Please call patient and let him know that all levels are normal.  -negative for hepatitis C -negative rheumatoid factor, negative ANA -no abnormal levels of heavy metals found in the blood -B12 and folate levels normals -vitamin D is low normal, can take OTC supplement if desired.  -no anemia found.  Blood glucose is slightly low but has always been normal previously. Only 1 point below normal, not clinically significant at this time.

## 2021-07-16 NOTE — Progress Notes (Signed)
Please call patient and let him know that all levels are normal.  -negative for hepatitis C -negative rheumatoid factor, negative ANA -no abnormal levels of heavy metals found in the blood -B12 and folate levels normals -vitamin D is low normal, can take OTC supplement if desired.  -no anemia found.  Blood glucose is slightly low but has always been normal previously. Only 1 point below normal, not clinically significant at this time.

## 2021-09-08 ENCOUNTER — Encounter: Payer: Self-pay | Admitting: Nurse Practitioner

## 2021-09-08 ENCOUNTER — Other Ambulatory Visit: Payer: Self-pay

## 2021-09-08 ENCOUNTER — Ambulatory Visit: Payer: BC Managed Care – PPO | Admitting: Nurse Practitioner

## 2021-09-08 VITALS — BP 124/75 | HR 92 | Temp 98.9°F | Resp 16 | Ht 72.0 in | Wt 200.0 lb

## 2021-09-08 DIAGNOSIS — R6889 Other general symptoms and signs: Secondary | ICD-10-CM | POA: Diagnosis not present

## 2021-09-08 DIAGNOSIS — B9789 Other viral agents as the cause of diseases classified elsewhere: Secondary | ICD-10-CM

## 2021-09-08 DIAGNOSIS — J029 Acute pharyngitis, unspecified: Secondary | ICD-10-CM | POA: Diagnosis not present

## 2021-09-08 DIAGNOSIS — J019 Acute sinusitis, unspecified: Secondary | ICD-10-CM

## 2021-09-08 LAB — POCT INFLUENZA A/B
Influenza A, POC: NEGATIVE
Influenza B, POC: NEGATIVE

## 2021-09-08 LAB — POCT RAPID STREP A (OFFICE): Rapid Strep A Screen: NEGATIVE

## 2021-09-08 NOTE — Progress Notes (Signed)
[facility] [address] [place], [place] [address]  Internal MEDICINE  Office Visit Note  Patient Name: Vincent Huffman  [id number]  [id number]  Date of Service: 09/08/2021  Chief Complaint  Patient presents with   Acute Visit    Neg covid test, request flu and strep    Sore Throat   Fatigue     Sore Throat  Associated symptoms include congestion and coughing. Pertinent negatives include no abdominal pain, diarrhea, neck pain, shortness of breath or vomiting.  Vincent Huffman presents for an acute sick visit for symptoms of sinusitis. He was negative for covid. He was tested for flu and strep in office today but was negative for both. He reports fatigue, sore throat, cough, sinus pressure. Symptoms started a few days ago.      Current Medication:  Outpatient Encounter Medications as of 09/08/2021  Medication Sig   baclofen (LIORESAL) 10 MG tablet Take 1 tablet (10 mg total) by mouth 3 (three) times daily.   carisoprodol (SOMA) 350 MG tablet Take 1 tablet (350 mg total) by mouth daily as needed for muscle spasms.   cyclobenzaprine (FLEXERIL) 10 MG tablet Take 1 tablet (10 mg total) by mouth at bedtime. Take one tab po qhs for back spasm prn only   naproxen (NAPROSYN) 500 MG tablet Take 1 tablet (500 mg total) by mouth 2 (two) times daily with a meal.   tizanidine (ZANAFLEX) 2 MG capsule Take 1 capsule (2 mg total) by mouth 3 (three) times daily.   [DISCONTINUED] ranitidine (ZANTAC) 150 MG capsule Take 1 capsule (150 mg total) by mouth 2 (two) times daily.   No facility-administered encounter medications on file as of 09/08/2021.      Medical History: Past Medical History:  Diagnosis Date   Chronic reflux esophagitis    Degenerative joint disease (DJD) of lumbar spine    Esophagitis    GERD (gastroesophageal reflux disease)      Vital Signs: BP 124/75    Pulse 92    Temp 98.9 F (37.2 C)    Resp 16    Ht 6' (1.829 m)    Wt 200 lb (90.7 kg)    SpO2 97%    BMI 27.12  kg/m    Review of Systems  Constitutional:  Positive for fatigue. Negative for chills, fever and unexpected weight change.  HENT:  Positive for congestion, postnasal drip, rhinorrhea, sinus pressure and sore throat. Negative for sneezing.   Eyes:  Negative for redness.  Respiratory:  Positive for cough. Negative for chest tightness and shortness of breath.   Cardiovascular:  Negative for chest pain and palpitations.  Gastrointestinal:  Negative for abdominal pain, constipation, diarrhea, nausea and vomiting.  Genitourinary:  Negative for dysuria and frequency.  Musculoskeletal:  Negative for arthralgias, back pain, joint swelling and neck pain.  Skin:  Negative for rash.  Neurological: Negative.  Negative for tremors and numbness.  Hematological:  Negative for adenopathy. Does not bruise/bleed easily.  Psychiatric/Behavioral:  Negative for behavioral problems (Depression), sleep disturbance and suicidal ideas. The patient is not nervous/anxious.    Physical Exam Vitals reviewed.  Constitutional:      General: He is not in acute distress.    Appearance: He is not ill-appearing.  HENT:     Head: Normocephalic and atraumatic.     Nose: Congestion and rhinorrhea present.     Mouth/Throat:     Mouth: Mucous membranes are moist.  Eyes:     Pupils: Pupils are equal, round, and  reactive to light.  Cardiovascular:     Rate and Rhythm: Normal rate and regular rhythm.  Pulmonary:     Effort: Pulmonary effort is normal. No respiratory distress.  Neurological:     Mental Status: He is alert and oriented to person, place, and time.  Psychiatric:        Mood and Affect: Mood normal.        Behavior: Behavior normal.      Assessment/Plan: 1. Acute viral sinusitis Patient requests to try OTC medication for symptomatic relief of symptoms and will call the clinic if symptoms persist or worsen after 7 days.   2. Flu-like symptoms Negative for flu - POCT Influenza A/B  3. Sore  throat Negative for strep - POCT rapid strep A   General Counseling: Vincent Huffman verbalizes understanding of the findings of todays visit and agrees with plan of treatment. I have discussed any further diagnostic evaluation that may be needed or ordered today. We also reviewed his medications today. he has been encouraged to call the office with any questions or concerns that should arise related to todays visit.    Counseling:    Orders Placed This Encounter  Procedures   POCT Influenza A/B   POCT rapid strep A    No orders of the defined types were placed in this encounter.   Return if symptoms worsen or fail to improve.  [place] Controlled Substance Database was reviewed by me for overdose risk score (ORS)  Time spent:20 Minutes Time spent with patient included reviewing progress notes, labs, imaging studies, and discussing plan for follow up.   This patient was seen by Sallyanne Kuster, FNP-C in collaboration with Dr. Beverely Risen as a part of collaborative care agreement.  Margaretha Mahan R. Tedd Sias, MSN, FNP-C Internal Medicine

## 2021-09-22 ENCOUNTER — Encounter: Payer: Self-pay | Admitting: Nurse Practitioner

## 2021-09-22 DIAGNOSIS — R491 Aphonia: Secondary | ICD-10-CM

## 2021-09-22 DIAGNOSIS — R053 Chronic cough: Secondary | ICD-10-CM

## 2021-09-29 ENCOUNTER — Telehealth: Payer: Self-pay

## 2021-09-29 NOTE — Telephone Encounter (Signed)
Referral sent 

## 2021-09-29 NOTE — Telephone Encounter (Signed)
Otolaryngology referral manually faxed to Dr. Lenard Simmer w/ [facility] [phone]

## 2021-10-25 ENCOUNTER — Telehealth: Payer: BC Managed Care – PPO | Admitting: Family Medicine

## 2021-10-25 DIAGNOSIS — I499 Cardiac arrhythmia, unspecified: Secondary | ICD-10-CM

## 2021-10-25 NOTE — Progress Notes (Signed)
Based on what you shared with me, I feel your condition warrants further evaluation and I recommend that you be seen in a face to face visit. ?  ?NOTE: There will be NO CHARGE for this eVisit ?  ?If you are having a true medical emergency please call 911.   ?  ? For an urgent face to face visit, [facility] has six urgent care centers for your convenience:  ?  ? [facility] ?Get Driving Directions ?[phone] ?[phone] ?[place], [place] [address] ?  ? [facility]) ?Get Driving Directions ?[phone] ?[address] ?[place], [place] [address] ? ?[facility]) ?Get Driving Directions ?[phone] ?[address] ?[place],  [place]  [address] ? ?[facility] ?Get Driving Directions ?[phone] ?[address], [address] ?[place], [place] [address] ?  ?[facility] [place] ?Get Driving Directions  ?[phone] ?[address] ?[address] ?[place], [place] [address] ?  ?[facility] ?Get Driving Directions ?[phone] ?[address] ?[place], [place] [address] ? ?Your MyChart E-visit questionnaire answers were reviewed by a board certified advanced clinical practitioner to complete your personal care plan based on your specific symptoms.  Thank you for using e-Visits. ?   ? have provided 5 minutes of non face to face time during this encounter for chart review and documentation.   ? ? ?

## 2022-01-08 ENCOUNTER — Telehealth: Payer: Self-pay

## 2022-01-08 NOTE — Telephone Encounter (Signed)
Left vm and sent mychart message to confirm 01/15/22 appointment-Toni 

## 2022-01-15 ENCOUNTER — Ambulatory Visit (INDEPENDENT_AMBULATORY_CARE_PROVIDER_SITE_OTHER): Payer: BC Managed Care – PPO | Admitting: Nurse Practitioner

## 2022-01-15 ENCOUNTER — Encounter: Payer: Self-pay | Admitting: Nurse Practitioner

## 2022-01-15 VITALS — BP 108/74 | HR 66 | Temp 98.6°F | Resp 16 | Ht 72.0 in | Wt 197.6 lb

## 2022-01-15 DIAGNOSIS — Z0001 Encounter for general adult medical examination with abnormal findings: Secondary | ICD-10-CM

## 2022-01-15 DIAGNOSIS — R5383 Other fatigue: Secondary | ICD-10-CM

## 2022-01-15 DIAGNOSIS — I517 Cardiomegaly: Secondary | ICD-10-CM | POA: Diagnosis not present

## 2022-01-15 DIAGNOSIS — G479 Sleep disorder, unspecified: Secondary | ICD-10-CM

## 2022-01-15 DIAGNOSIS — Z1212 Encounter for screening for malignant neoplasm of rectum: Secondary | ICD-10-CM

## 2022-01-15 DIAGNOSIS — E782 Mixed hyperlipidemia: Secondary | ICD-10-CM

## 2022-01-15 DIAGNOSIS — Z0000 Encounter for general adult medical examination without abnormal findings: Secondary | ICD-10-CM

## 2022-01-15 DIAGNOSIS — Z760 Encounter for issue of repeat prescription: Secondary | ICD-10-CM

## 2022-01-15 DIAGNOSIS — L649 Androgenic alopecia, unspecified: Secondary | ICD-10-CM

## 2022-01-15 DIAGNOSIS — E870 Hyperosmolality and hypernatremia: Secondary | ICD-10-CM

## 2022-01-15 DIAGNOSIS — E559 Vitamin D deficiency, unspecified: Secondary | ICD-10-CM

## 2022-01-15 DIAGNOSIS — R300 Dysuria: Secondary | ICD-10-CM

## 2022-01-15 DIAGNOSIS — M545 Low back pain, unspecified: Secondary | ICD-10-CM

## 2022-01-15 DIAGNOSIS — Z1211 Encounter for screening for malignant neoplasm of colon: Secondary | ICD-10-CM

## 2022-01-15 DIAGNOSIS — G8929 Other chronic pain: Secondary | ICD-10-CM

## 2022-01-15 DIAGNOSIS — M064 Inflammatory polyarthropathy: Secondary | ICD-10-CM

## 2022-01-15 DIAGNOSIS — E538 Deficiency of other specified B group vitamins: Secondary | ICD-10-CM

## 2022-01-15 MED ORDER — CARISOPRODOL 350 MG PO TABS: 350.0000 mg | ORAL_TABLET | Freq: Every day | ORAL | 0 refills | Status: DC | PRN

## 2022-01-15 MED ORDER — BACLOFEN 10 MG PO TABS: 10.0000 mg | ORAL_TABLET | Freq: Three times a day (TID) | ORAL | 0 refills | Status: DC

## 2022-01-15 MED ORDER — CYCLOBENZAPRINE HCL 10 MG PO TABS: 10.0000 mg | ORAL_TABLET | Freq: Every day | ORAL | 1 refills | Status: DC

## 2022-01-15 MED ORDER — TIZANIDINE HCL 2 MG PO CAPS: 2.0000 mg | ORAL_CAPSULE | Freq: Three times a day (TID) | ORAL | 0 refills | Status: DC

## 2022-01-15 NOTE — Progress Notes (Signed)
[facility] [place], [place] [address]  Internal MEDICINE  Office Visit Note  Patient Name: Vincent Huffman  [id number]  [id number]  Date of Service: 01/15/2022  Chief Complaint  Patient presents with   Annual Exam   Gastroesophageal Reflux    HPI Vincent Huffman presents for an annual well visit and physical exam.   --well appearing 46 yo male with arthritis, degenerative joint disease of the lumbar spine, chronic reflux esophagitis. --lives alone and works as a Pharmacist, hospital at a middle school . --due for med refills, routine labs. --requesting additional labs to be checked: cortisol, fsh, lh, testosterone and prolactin --due for colorectal cancer screening, wants GI referral      Current Medication: Outpatient Encounter Medications as of 01/15/2022  Medication Sig   naproxen (NAPROSYN) 500 MG tablet Take 1 tablet (500 mg total) by mouth 2 (two) times daily with a meal.   [DISCONTINUED] baclofen (LIORESAL) 10 MG tablet Take 1 tablet (10 mg total) by mouth 3 (three) times daily.   [DISCONTINUED] carisoprodol (SOMA) 350 MG tablet Take 1 tablet (350 mg total) by mouth daily as needed for muscle spasms.   [DISCONTINUED] cyclobenzaprine (FLEXERIL) 10 MG tablet Take 1 tablet (10 mg total) by mouth at bedtime. Take one tab po qhs for back spasm prn only   [DISCONTINUED] tizanidine (ZANAFLEX) 2 MG capsule Take 1 capsule (2 mg total) by mouth 3 (three) times daily.   baclofen (LIORESAL) 10 MG tablet Take 1 tablet (10 mg total) by mouth 3 (three) times daily.   carisoprodol (SOMA) 350 MG tablet Take 1 tablet (350 mg total) by mouth daily as needed for muscle spasms.   cyclobenzaprine (FLEXERIL) 10 MG tablet Take 1 tablet (10 mg total) by mouth at bedtime. Take one tab po qhs for back spasm prn only   tizanidine (ZANAFLEX) 2 MG capsule Take 1 capsule (2 mg total) by mouth 3 (three) times daily.   [DISCONTINUED] ranitidine (ZANTAC) 150 MG capsule Take 1 capsule (150 mg total)  by mouth 2 (two) times daily.   No facility-administered encounter medications on file as of 01/15/2022.    Surgical History: Past Surgical History:  Procedure Laterality Date   ADENOIDECTOMY     ESOPHAGOGASTRODUODENOSCOPY N/A 02/28/2020   Procedure: ESOPHAGOGASTRODUODENOSCOPY (EGD);  Surgeon: Toledo, Benay Pike, MD;  Location: [facility] ENDOSCOPY;  Service: Gastroenterology;  Laterality: N/A;   TONSILLECTOMY      Medical History: Past Medical History:  Diagnosis Date   Chronic reflux esophagitis    Degenerative joint disease (DJD) of lumbar spine    Esophagitis    GERD (gastroesophageal reflux disease)     Family History: Family History  Problem Relation Age of Onset   Heart disease Mother    Heart disease Father    Diabetes Maternal Grandfather     Social History   Socioeconomic History   Marital status: Single    Spouse name: Not on file   Number of children: Not on file   Years of education: Not on file   Highest education level: Not on file  Occupational History   Occupation: Working Clinical biochemist   Tobacco Use   Smoking status: Never   Smokeless tobacco: Never  Vaping Use   Vaping Use: Never used  Substance and Sexual Activity   Alcohol use: No   Drug use: No   Sexual activity: Not on file  Other Topics Concern   Not on file  Social History Narrative   Not on file   Social  Determinants of Health   Financial Resource Strain: High Risk (01/11/2018)   Overall Financial Resource Strain (CARDIA)    Difficulty of Paying Living Expenses: Hard  Food Insecurity: Food Insecurity Present (01/11/2018)   Hunger Vital Sign    Worried About Running Out of Food in the Last Year: Sometimes true    Ran Out of Food in the Last Year: Sometimes true  Transportation Needs: No Transportation Needs (01/11/2018)   PRAPARE - Hydrologist (Medical): No    Lack of Transportation (Non-Medical): No  Physical Activity: Sufficiently Active (01/11/2018)    Exercise Vital Sign    Days of Exercise per Week: 5 days    Minutes of Exercise per Session: 60 min  Stress: No Stress Concern Present (01/11/2018)   [place]    Feeling of Stress : Only a little  Social Connections: Moderately Isolated (01/11/2018)   Social Connection and Isolation Panel [NHANES]    Frequency of Communication with Friends and Family: Once a week    Frequency of Social Gatherings with Friends and Family: Once a week    Attends Religious Services: More than 4 times per year    Active Member of [employer] or Organizations: No    Attends Archivist Meetings: Never    Marital Status: Never married  Intimate Partner Violence: Unknown (01/11/2018)   Humiliation, Afraid, Rape, and Kick questionnaire    Fear of Current or Ex-Partner: Patient refused    Emotionally Abused: Patient refused    Physically Abused: Patient refused    Sexually Abused: Patient refused      Review of Systems  Constitutional:  Negative for activity change, appetite change, chills, fatigue, fever and unexpected weight change.  HENT: Negative.  Negative for congestion, ear pain, rhinorrhea, sore throat and trouble swallowing.   Eyes: Negative.   Respiratory: Negative.  Negative for cough, chest tightness, shortness of breath and wheezing.   Cardiovascular: Negative.  Negative for chest pain and palpitations.  Gastrointestinal: Negative.  Negative for abdominal pain, blood in stool, constipation, diarrhea, nausea and vomiting.  Endocrine: Negative.   Genitourinary: Negative.  Negative for difficulty urinating, dysuria, frequency, hematuria and urgency.  Musculoskeletal: Negative.  Negative for arthralgias, back pain, joint swelling, myalgias and neck pain.  Skin: Negative.  Negative for rash and wound.  Allergic/Immunologic: Negative.  Negative for immunocompromised state.  Neurological: Negative.  Negative for dizziness, seizures,  numbness and headaches.  Hematological: Negative.   Psychiatric/Behavioral: Negative.  Negative for behavioral problems, self-injury and suicidal ideas. The patient is not nervous/anxious.     Vital Signs: BP 108/74   Pulse 66   Temp 98.6 F (37 C)   Resp 16   Ht 6' (1.829 m)   Wt 197 lb 9.6 oz (89.6 kg)   SpO2 98%   BMI 26.80 kg/m    Physical Exam Vitals reviewed.  Constitutional:      General: He is not in acute distress.    Appearance: He is not ill-appearing.  HENT:     Head: Normocephalic and atraumatic.     Right Ear: Tympanic membrane, ear canal and external ear normal.     Left Ear: Tympanic membrane, ear canal and external ear normal.     Nose: Nose normal. No congestion.     Mouth/Throat:     Mouth: Mucous membranes are moist.     Pharynx: Oropharynx is clear. No posterior oropharyngeal erythema.  Eyes:  Extraocular Movements: Extraocular movements intact.     Conjunctiva/sclera: Conjunctivae normal.     Pupils: Pupils are equal, round, and reactive to light.  Cardiovascular:     Rate and Rhythm: Normal rate and regular rhythm.     Pulses: Normal pulses.     Heart sounds: Normal heart sounds. No murmur heard. Pulmonary:     Effort: Pulmonary effort is normal. No respiratory distress.     Breath sounds: Normal breath sounds.  Abdominal:     General: Bowel sounds are normal. There is no distension.     Palpations: Abdomen is soft. There is no mass.     Tenderness: There is no abdominal tenderness. There is no guarding or rebound.     Hernia: No hernia is present.  Musculoskeletal:        General: Normal range of motion.     Cervical back: Normal range of motion and neck supple.  Lymphadenopathy:     Cervical: No cervical adenopathy.  Skin:    General: Skin is warm and dry.     Capillary Refill: Capillary refill takes less than 2 seconds.  Neurological:     Mental Status: He is alert and oriented to person, place, and time.  Psychiatric:        Mood  and Affect: Mood normal.        Behavior: Behavior normal.        Thought Content: Thought content normal.        Judgment: Judgment normal.        Assessment/Plan: 1. Encounter for routine adult health examination with abnormal findings Age-appropriate preventive screenings and vaccinations discussed, annual physical exam completed. Routine labs for health maintenance ordered, see below. PHM updated.  - CBC with Differential/Platelet - CMP14+EGFR - Lipid Profile - TSH + free T4 - Vitamin D (25 hydroxy) - B12 and Folate Panel - Iron, TIBC and Ferritin Panel - Estradiol - FSH/LH - Prolactin - Testosterone, Free, Total, SHBG - Cortisol-am, blood - Magnesium - Phosphorus   2. Cardiomegaly Routine and additional labs ordered per patient request - TSH + free T4 - Vitamin D (25 hydroxy) - B12 and Folate Panel - Iron, TIBC and Ferritin Panel - Estradiol - FSH/LH - Prolactin - Testosterone, Free, Total, SHBG - Cortisol-am, blood - Magnesium - Phosphorus  3. 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 Mixed hyperlipidemia - CMP14+EGFR - Lipid Profile  6. B12 deficiency - B12 and Folate Panel - Iron, TIBC and Ferritin Panel  7. Vitamin D deficiency - Vitamin D (25 hydroxy)  8. Dysuria Routine urinalysis - UA/M w/rflx Culture, Routine - Microscopic Examination  9. Screening for colorectal cancer For routine colonoscopy - Ambulatory referral to Gastroenterology  10. Medication refill - carisoprodol (SOMA) 350 MG tablet; Take 1 tablet (350 mg  total) by mouth daily as needed for muscle spasms.  Dispense: 30 tablet; Refill: 0 - tizanidine (ZANAFLEX) 2 MG capsule; Take 1 capsule (2 mg total) by mouth 3 (three) times daily.  Dispense: 60 capsule; Refill: 0 - cyclobenzaprine (FLEXERIL) 10 MG tablet; Take 1 tablet (10  mg total) by mouth at bedtime. Take one tab po qhs for back spasm prn only  Dispense: 30 tablet; Refill: 1 - baclofen (LIORESAL) 10 MG tablet; Take 1 tablet (10 mg total) by mouth 3 (three) times daily.  Dispense: 30 each; Refill: 0      General Counseling: Vincent Huffman verbalizes understanding of the findings of todays visit and agrees with plan of treatment. I have discussed any further diagnostic evaluation that may be needed or ordered today. We also reviewed his medications today. he has been encouraged to call the office with any questions or concerns that should arise related to todays visit.    Orders Placed This Encounter  Procedures   Microscopic Examination   UA/M w/rflx Culture, Routine   CBC with Differential/Platelet   CMP14+EGFR   Lipid Profile   TSH + free T4   Vitamin D (25 hydroxy)   B12 and Folate Panel   Iron, TIBC and Ferritin Panel   Estradiol   FSH/LH   Prolactin   Testosterone, Free, Total, SHBG   Cortisol-am, blood   Magnesium   Phosphorus   Ambulatory referral to Gastroenterology    Meds ordered this encounter  Medications   carisoprodol (SOMA) 350 MG tablet    Sig: Take 1 tablet (350 mg total) by mouth daily as needed for muscle spasms.    Dispense:  30 tablet    Refill:  0    For future refills, patient will call   tizanidine (ZANAFLEX) 2 MG capsule    Sig: Take 1 capsule (2 mg total) by mouth 3 (three) times daily.    Dispense:  60 capsule    Refill:  0    For future refills, patient will call   cyclobenzaprine (FLEXERIL) 10 MG tablet    Sig: Take 1 tablet (10 mg total) by mouth at bedtime. Take one tab po qhs for back spasm prn only    Dispense:  30 tablet    Refill:  1     For future refills, patient will call   baclofen (LIORESAL) 10 MG tablet    Sig: Take 1 tablet (10 mg total) by mouth 3 (three) times daily.    Dispense:  30 each    Refill:  0    For future refills, patient will call    Return in 6 months (on 07/17/2022) for F/U, Vincent Huffman PCP.   Total time spent:30 Minutes Time spent includes review of chart, medications, test results, and follow up plan with the patient.   [place] Controlled Substance Database was reviewed by me.  This patient was seen by Jonetta Osgood, FNP-C in collaboration with Dr. Clayborn Bigness as a part of collaborative care agreement.  Taven Strite R. Valetta Fuller, MSN, FNP-C Internal medicine

## 2022-01-16 LAB — UA/M W/RFLX CULTURE, ROUTINE
Bilirubin, UA: NEGATIVE
Glucose, UA: NEGATIVE
Ketones, UA: NEGATIVE
Leukocytes,UA: NEGATIVE
Nitrite, UA: NEGATIVE
Protein,UA: NEGATIVE
RBC, UA: NEGATIVE
Specific Gravity, UA: 1.02 (ref 1.005–1.030)
Urobilinogen, Ur: 1 mg/dL (ref 0.2–1.0)
pH, UA: 7.5 (ref 5.0–7.5)

## 2022-01-16 LAB — MICROSCOPIC EXAMINATION
Bacteria, UA: NONE SEEN
Casts: NONE SEEN /lpf
Epithelial Cells (non renal): NONE SEEN /hpf (ref 0–10)
WBC, UA: NONE SEEN /hpf (ref 0–5)

## 2022-01-21 LAB — TESTOSTERONE, FREE, TOTAL, SHBG
Sex Hormone Binding: 48 nmol/L (ref 16.5–55.9)
Testosterone, Free: 14.6 pg/mL (ref 6.8–21.5)
Testosterone: 804 ng/dL (ref 264–916)

## 2022-01-21 LAB — CBC WITH DIFFERENTIAL/PLATELET
Basophils Absolute: 0 10*3/uL (ref 0.0–0.2)
Basos: 1 %
EOS (ABSOLUTE): 0.1 10*3/uL (ref 0.0–0.4)
Eos: 2 %
Hematocrit: 42.4 % (ref 37.5–51.0)
Hemoglobin: 14 g/dL (ref 13.0–17.7)
Immature Grans (Abs): 0 10*3/uL (ref 0.0–0.1)
Immature Granulocytes: 0 %
Lymphocytes Absolute: 1.9 10*3/uL (ref 0.7–3.1)
Lymphs: 38 %
MCH: 30.4 pg (ref 26.6–33.0)
MCHC: 33 g/dL (ref 31.5–35.7)
MCV: 92 fL (ref 79–97)
Monocytes Absolute: 0.4 10*3/uL (ref 0.1–0.9)
Monocytes: 7 %
Neutrophils Absolute: 2.7 10*3/uL (ref 1.4–7.0)
Neutrophils: 52 %
Platelets: 247 10*3/uL (ref 150–450)
RBC: 4.61 x10E6/uL (ref 4.14–5.80)
RDW: 12.5 % (ref 11.6–15.4)
WBC: 5.1 10*3/uL (ref 3.4–10.8)

## 2022-01-21 LAB — ESTRADIOL: Estradiol: 34.6 pg/mL (ref 7.6–42.6)

## 2022-01-21 LAB — IRON,TIBC AND FERRITIN PANEL
Ferritin: 216 ng/mL (ref 30–400)
Iron Saturation: 27 % (ref 15–55)
Iron: 81 ug/dL (ref 38–169)
Total Iron Binding Capacity: 301 ug/dL (ref 250–450)
UIBC: 220 ug/dL (ref 111–343)

## 2022-01-21 LAB — B12 AND FOLATE PANEL
Folate: 13.9 ng/mL (ref >3.0)
Vitamin B-12: 1118 pg/mL (ref 232–1245)

## 2022-01-21 LAB — FSH/LH
FSH: 2.8 m[IU]/mL (ref 1.5–12.4)
LH: 5.5 m[IU]/mL (ref 1.7–8.6)

## 2022-01-21 LAB — LIPID PANEL
Chol/HDL Ratio: 2.8 ratio (ref 0.0–5.0)
Cholesterol, Total: 150 mg/dL (ref 100–199)
HDL: 54 mg/dL (ref >39)
LDL Chol Calc (NIH): 88 mg/dL (ref 0–99)
Triglycerides: 35 mg/dL (ref 0–149)
VLDL Cholesterol Cal: 8 mg/dL (ref 5–40)

## 2022-01-21 LAB — TSH+FREE T4
Free T4: 1.18 ng/dL (ref 0.82–1.77)
TSH: 1.95 u[IU]/mL (ref 0.450–4.500)

## 2022-01-21 LAB — CMP14+EGFR
ALT: 27 IU/L (ref 0–44)
AST: 22 IU/L (ref 0–40)
Albumin/Globulin Ratio: 2.4 — ABNORMAL HIGH (ref 1.2–2.2)
Albumin: 4.6 g/dL (ref 4.0–5.0)
Alkaline Phosphatase: 59 IU/L (ref 44–121)
BUN/Creatinine Ratio: 9 (ref 9–20)
BUN: 7 mg/dL (ref 6–24)
Bilirubin Total: 0.4 mg/dL (ref 0.0–1.2)
CO2: 24 mmol/L (ref 20–29)
Calcium: 9.2 mg/dL (ref 8.7–10.2)
Chloride: 105 mmol/L (ref 96–106)
Creatinine, Ser: 0.78 mg/dL (ref 0.76–1.27)
Globulin, Total: 1.9 g/dL (ref 1.5–4.5)
Glucose: 90 mg/dL (ref 70–99)
Potassium: 4.5 mmol/L (ref 3.5–5.2)
Sodium: 141 mmol/L (ref 134–144)
Total Protein: 6.5 g/dL (ref 6.0–8.5)
eGFR: 112 mL/min/{1.73_m2} (ref >59)

## 2022-01-21 LAB — PHOSPHORUS: Phosphorus: 3.4 mg/dL (ref 2.8–4.1)

## 2022-01-21 LAB — CORTISOL-AM, BLOOD: Cortisol - AM: 6.2 ug/dL (ref 6.2–19.4)

## 2022-01-21 LAB — VITAMIN D 25 HYDROXY (VIT D DEFICIENCY, FRACTURES): Vit D, 25-Hydroxy: 44.1 ng/mL (ref 30.0–100.0)

## 2022-01-21 LAB — PROLACTIN: Prolactin: 6.1 ng/mL (ref 4.0–15.2)

## 2022-01-21 LAB — MAGNESIUM: Magnesium: 2.2 mg/dL (ref 1.6–2.3)

## 2022-02-23 ENCOUNTER — Ambulatory Visit
Admission: RE | Admit: 2022-02-23 | Discharge: 2022-02-23 | Disposition: A | Discharge status: Home / Self Care | Payer: BC Managed Care – PPO | Source: Ambulatory Visit | Attending: Nurse Practitioner | Admitting: Nurse Practitioner

## 2022-02-23 ENCOUNTER — Ambulatory Visit
Admission: RE | Admit: 2022-02-23 | Discharge: 2022-02-23 | Disposition: A | Discharge status: Home / Self Care | Payer: BC Managed Care – PPO | Attending: Nurse Practitioner | Admitting: Nurse Practitioner

## 2022-02-23 ENCOUNTER — Ambulatory Visit: Payer: BC Managed Care – PPO | Admitting: Nurse Practitioner

## 2022-02-23 ENCOUNTER — Encounter: Payer: Self-pay | Admitting: Nurse Practitioner

## 2022-02-23 VITALS — BP 123/76 | HR 83 | Temp 97.9°F | Resp 16 | Ht 72.0 in | Wt 206.6 lb

## 2022-02-23 DIAGNOSIS — S99921A Unspecified injury of right foot, initial encounter: Secondary | ICD-10-CM

## 2022-02-23 DIAGNOSIS — M79674 Pain in right toe(s): Secondary | ICD-10-CM

## 2022-02-23 DIAGNOSIS — L649 Androgenic alopecia, unspecified: Secondary | ICD-10-CM | POA: Diagnosis not present

## 2022-02-23 NOTE — Progress Notes (Signed)
[facility] [address] [place], [place] [address]  Internal MEDICINE  Office Visit Note  Patient Name: Vincent Huffman  [id number]  [id number]  Date of Service: 02/23/2022  Chief Complaint  Patient presents with   Toe Injury    Ongoing pain for 2 months     HPI Vincent Huffman presents for an acute sick visit forRight great toe pain, patient thinks it is possibly broken, hurting x2 months,Fell while playing baseball.   Current Medication:  Outpatient Encounter Medications as of 02/23/2022  Medication Sig   baclofen (LIORESAL) 10 MG tablet Take 1 tablet (10 mg total) by mouth 3 (three) times daily.   carisoprodol (SOMA) 350 MG tablet Take 1 tablet (350 mg total) by mouth daily as needed for muscle spasms.   cyclobenzaprine (FLEXERIL) 10 MG tablet Take 1 tablet (10 mg total) by mouth at bedtime. Take one tab po qhs for back spasm prn only   naproxen (NAPROSYN) 500 MG tablet Take 1 tablet (500 mg total) by mouth 2 (two) times daily with a meal.   tizanidine (ZANAFLEX) 2 MG capsule Take 1 capsule (2 mg total) by mouth 3 (three) times daily.   [DISCONTINUED] ranitidine (ZANTAC) 150 MG capsule Take 1 capsule (150 mg total) by mouth 2 (two) times daily.   No facility-administered encounter medications on file as of 02/23/2022.      Medical History: Past Medical History:  Diagnosis Date   Chronic reflux esophagitis    Degenerative joint disease (DJD) of lumbar spine    Esophagitis    GERD (gastroesophageal reflux disease)      Vital Signs: BP 123/76   Pulse 83   Temp 97.9 F (36.6 C)   Resp 16   Ht 6' (1.829 m)   Wt 206 lb 9.6 oz (93.7 kg)   SpO2 96%   BMI 28.02 kg/m    Review of Systems  Respiratory: Negative.  Negative for apnea, cough, shortness of breath and wheezing.   Cardiovascular: Negative.  Negative for chest pain and palpitations.  Musculoskeletal:  Positive for arthralgias (right great toe) and joint swelling.    Physical Exam Vitals reviewed.   Constitutional:      General: He is not in acute distress.    Appearance: Normal appearance. He is not ill-appearing.  HENT:     Head: Normocephalic and atraumatic.  Eyes:     Pupils: Pupils are equal, round, and reactive to light.  Cardiovascular:     Rate and Rhythm: Normal rate and regular rhythm.  Pulmonary:     Effort: Pulmonary effort is normal. No respiratory distress.  Musculoskeletal:        General: Tenderness (right great toe) and signs of injury (fell 2 months ago, having pain of the right great toe since then) present.     Comments: No swelling, redness, bruising or broken skin noted on right foot or right great toe.  Neurological:     Mental Status: He is alert and oriented to person, place, and time.  Psychiatric:        Mood and Affect: Mood normal.        Behavior: Behavior normal.       Assessment/Plan: 1. Pain of right great toe Xray ordered to rule out fracture - DG Foot Complete Right; Future  2. Injury of right great toe, initial encounter Xray ordered to rule out fracture - DG Foot Complete Right; Future  3. Male pattern baldness Wants to see dermatologist for hair loss - Ambulatory referral  to Dermatology   General Counseling: Vincent Huffman understanding of the findings of todays visit and agrees with plan of treatment. I have discussed any further diagnostic evaluation that may be needed or ordered today. We also reviewed his medications today. he has been encouraged to call the office with any questions or concerns that should arise related to todays visit.    Counseling:    Orders Placed This Encounter  Procedures   DG Foot Complete Right   Ambulatory referral to Dermatology    No orders of the defined types were placed in this encounter.   Return if symptoms worsen or fail to improve.  [name] Controlled Substance Database was reviewed by me for overdose risk score (ORS)  Time spent:30 Minutes Time spent with patient included  reviewing progress notes, labs, imaging studies, and discussing plan for follow up.   This patient was seen by Vincent Kuster, FNP-C in collaboration with Dr. Beverely Huffman as a part of collaborative care agreement.  Vincent Huffman R. Vincent Sias, MSN, FNP-C Internal Medicine

## 2022-02-24 ENCOUNTER — Telehealth: Payer: Self-pay

## 2022-02-24 NOTE — Telephone Encounter (Signed)
Awaiting 02/23/22 office notes for Dermatology referral-Toni

## 2022-02-25 ENCOUNTER — Encounter: Payer: Self-pay | Admitting: Nurse Practitioner

## 2022-02-27 ENCOUNTER — Telehealth: Payer: Self-pay

## 2022-02-27 NOTE — Telephone Encounter (Signed)
Dermatology referral sent via [facility] to [facility]-Toni 

## 2022-03-03 ENCOUNTER — Encounter: Payer: Self-pay | Admitting: Nurse Practitioner

## 2022-03-04 ENCOUNTER — Other Ambulatory Visit: Payer: Self-pay

## 2022-03-04 DIAGNOSIS — M79674 Pain in right toe(s): Secondary | ICD-10-CM

## 2022-03-05 LAB — URIC ACID: Uric Acid: 5.5 mg/dL (ref 3.8–8.4)

## 2022-03-11 ENCOUNTER — Encounter: Payer: Self-pay | Admitting: Nurse Practitioner

## 2022-03-18 ENCOUNTER — Telehealth: Payer: Self-pay

## 2022-03-18 NOTE — Telephone Encounter (Signed)
Dermatology appt with [facility]>> 03/25/22 @ 11:30-Toni

## 2022-04-03 ENCOUNTER — Encounter: Payer: Self-pay | Admitting: Nurse Practitioner

## 2022-04-03 ENCOUNTER — Telehealth: Payer: Self-pay | Admitting: Nurse Practitioner

## 2022-04-03 ENCOUNTER — Ambulatory Visit: Payer: BC Managed Care – PPO | Admitting: Nurse Practitioner

## 2022-04-03 VITALS — BP 127/70 | HR 60 | Temp 98.4°F | Resp 16 | Ht 72.0 in | Wt 211.0 lb

## 2022-04-03 DIAGNOSIS — M79674 Pain in right toe(s): Secondary | ICD-10-CM

## 2022-04-03 DIAGNOSIS — M129 Arthropathy, unspecified: Secondary | ICD-10-CM

## 2022-04-03 DIAGNOSIS — L649 Androgenic alopecia, unspecified: Secondary | ICD-10-CM | POA: Diagnosis not present

## 2022-04-03 NOTE — Progress Notes (Unsigned)
[facility] [address] [place], [place] [address]  Internal MEDICINE  Office Visit Note  Patient Name: Vincent Huffman  [id number]  [id number]  Date of Service: 04/03/2022  Chief Complaint  Patient presents with   Follow-up    Right big toe injury   referral needed    HPI Quinlan presents for a follow-up visit for  Toe pain no better, xray  Hair loss finasteride? Topical?     Current Medication: Outpatient Encounter Medications as of 04/03/2022  Medication Sig   baclofen (LIORESAL) 10 MG tablet Take 1 tablet (10 mg total) by mouth 3 (three) times daily.   carisoprodol (SOMA) 350 MG tablet Take 1 tablet (350 mg total) by mouth daily as needed for muscle spasms.   cyclobenzaprine (FLEXERIL) 10 MG tablet Take 1 tablet (10 mg total) by mouth at bedtime. Take one tab po qhs for back spasm prn only   naproxen (NAPROSYN) 500 MG tablet Take 1 tablet (500 mg total) by mouth 2 (two) times daily with a meal.   tizanidine (ZANAFLEX) 2 MG capsule Take 1 capsule (2 mg total) by mouth 3 (three) times daily.   [DISCONTINUED] ranitidine (ZANTAC) 150 MG capsule Take 1 capsule (150 mg total) by mouth 2 (two) times daily.   No facility-administered encounter medications on file as of 04/03/2022.    Surgical History: Past Surgical History:  Procedure Laterality Date   ADENOIDECTOMY     ESOPHAGOGASTRODUODENOSCOPY N/A 02/28/2020   Procedure: ESOPHAGOGASTRODUODENOSCOPY (EGD);  Surgeon: Toledo, Boykin Nearing, MD;  Location: [facility] ENDOSCOPY;  Service: Gastroenterology;  Laterality: N/A;   TONSILLECTOMY      Medical History: Past Medical History:  Diagnosis Date   Chronic reflux esophagitis    Degenerative joint disease (DJD) of lumbar spine    Esophagitis    GERD (gastroesophageal reflux disease)     Family History: Family History  Problem Relation Age of Onset   Heart disease Mother    Heart disease Father    Diabetes Maternal Grandfather     Social History   Socioeconomic  History   Marital status: Single    Spouse name: Not on file   Number of children: Not on file   Years of education: Not on file   Highest education level: Not on file  Occupational History   Occupation: Working Personnel officer   Tobacco Use   Smoking status: Never   Smokeless tobacco: Never  Vaping Use   Vaping Use: Never used  Substance and Sexual Activity   Alcohol use: No   Drug use: No   Sexual activity: Not on file  Other Topics Concern   Not on file  Social History Narrative   Not on file   Social Determinants of Health   Financial Resource Strain: High Risk (01/11/2018)   Overall Financial Resource Strain (CARDIA)    Difficulty of Paying Living Expenses: Hard  Food Insecurity: Food Insecurity Present (01/11/2018)   Hunger Vital Sign    Worried About Running Out of Food in the Last Year: Sometimes true    Ran Out of Food in the Last Year: Sometimes true  Transportation Needs: No Transportation Needs (01/11/2018)   PRAPARE - Administrator, Civil Service (Medical): No    Lack of Transportation (Non-Medical): No  Physical Activity: Sufficiently Active (01/11/2018)   Exercise Vital Sign    Days of Exercise per Week: 5 days    Minutes of Exercise per Session: 60 min  Stress: No Stress Concern Present (01/11/2018)  [employer] of Occupational Health - Occupational Stress Questionnaire    Feeling of Stress : Only a little  Social Connections: Moderately Isolated (01/11/2018)   Social Connection and Isolation Panel [NHANES]    Frequency of Communication with Friends and Family: Once a week    Frequency of Social Gatherings with Friends and Family: Once a week    Attends Religious Services: More than 4 times per year    Active Member of [employer] or Organizations: No    Attends Banker Meetings: Never    Marital Status: Never married  Intimate Partner Violence: Unknown (01/11/2018)   Humiliation, Afraid, Rape, and Kick questionnaire    Fear of  Current or Ex-Partner: Patient refused    Emotionally Abused: Patient refused    Physically Abused: Patient refused    Sexually Abused: Patient refused      Review of Systems  Vital Signs: BP 127/70   Pulse 60   Temp 98.4 F (36.9 C)   Resp 16   Ht 6' (1.829 m)   Wt 211 lb (95.7 kg)   SpO2 96%   BMI 28.62 kg/m    Physical Exam     Assessment/Plan:   General Counseling: Khylon verbalizes understanding of the findings of todays visit and agrees with plan of treatment. I have discussed any further diagnostic evaluation that may be needed or ordered today. We also reviewed his medications today. he has been encouraged to call the office with any questions or concerns that should arise related to todays visit.    Orders Placed This Encounter  Procedures   Ambulatory referral to Physical Therapy   Ambulatory referral to Podiatry    No orders of the defined types were placed in this encounter.   Return if symptoms worsen or fail to improve.   Total time spent:*** Minutes Time spent includes review of chart, medications, test results, and follow up plan with the patient.   [place] Controlled Substance Database was reviewed by me.  This patient was seen by Sallyanne Kuster, FNP-C in collaboration with Dr. Beverely Risen as a part of collaborative care agreement.   Amorah Sebring R. Tedd Sias, MSN, FNP-C Internal medicine

## 2022-04-03 NOTE — Telephone Encounter (Signed)
Awaiting 04/03/22 office notes for Physical Therapy referral-Toni

## 2022-04-05 ENCOUNTER — Encounter: Payer: Self-pay | Admitting: Nurse Practitioner

## 2022-04-10 ENCOUNTER — Telehealth: Payer: Self-pay | Admitting: Nurse Practitioner

## 2022-04-10 NOTE — Telephone Encounter (Signed)
Physical Therapy referral faxed to Dr. Iona Hansen w/ [facility]-Toni

## 2022-04-25 ENCOUNTER — Emergency Department
Admission: EM | Admit: 2022-04-25 | Discharge: 2022-04-25 | Disposition: A | Discharge status: Home / Self Care | Payer: BC Managed Care – PPO | Attending: Emergency Medicine | Admitting: Emergency Medicine

## 2022-04-25 ENCOUNTER — Encounter: Payer: Self-pay | Admitting: Emergency Medicine

## 2022-04-25 DIAGNOSIS — T50905A Adverse effect of unspecified drugs, medicaments and biological substances, initial encounter: Secondary | ICD-10-CM

## 2022-04-25 DIAGNOSIS — B372 Candidiasis of skin and nail: Secondary | ICD-10-CM | POA: Diagnosis not present

## 2022-04-25 DIAGNOSIS — T4995XA Adverse effect of unspecified topical agent, initial encounter: Secondary | ICD-10-CM | POA: Diagnosis not present

## 2022-04-25 DIAGNOSIS — R1031 Right lower quadrant pain: Secondary | ICD-10-CM | POA: Diagnosis present

## 2022-04-25 MED ORDER — DROPERIDOL 2.5 MG/ML IJ SOLN
2.5000 mg | Freq: Once | INTRAMUSCULAR | Status: AC
  Administered 2022-04-25: 2.5 mg via INTRAVENOUS
  Filled 2022-04-25: qty 2

## 2022-04-25 MED ORDER — ZINC OXIDE 13 % EX CREA: TOPICAL_OINTMENT | Freq: Two times a day (BID) | CUTANEOUS | 0 refills | Status: DC

## 2022-04-25 MED ORDER — FLUCONAZOLE 150 MG PO TABS: 150.0000 mg | ORAL_TABLET | Freq: Once | ORAL | 0 refills | Status: AC

## 2022-04-25 MED ORDER — FLUCONAZOLE 50 MG PO TABS
150.0000 mg | ORAL_TABLET | ORAL | Status: AC
  Administered 2022-04-25: 150 mg via ORAL
  Filled 2022-04-25: qty 1

## 2022-04-25 MED ORDER — LIDOCAINE VISCOUS HCL 2 % MT SOLN
15.0000 mL | Freq: Once | OROMUCOSAL | Status: AC
  Administered 2022-04-25: 15 mL via OROMUCOSAL
  Filled 2022-04-25: qty 15

## 2022-04-25 MED ORDER — OXYCODONE-ACETAMINOPHEN 5-325 MG PO TABS
2.0000 | ORAL_TABLET | Freq: Once | ORAL | Status: AC
  Administered 2022-04-25: 2 via ORAL
  Filled 2022-04-25: qty 2

## 2022-04-25 NOTE — ED Triage Notes (Signed)
Pt presents via POV with complaints of groin pain and swelling for the last hour since applying calamine lotion to his testicles. He was under the impression he had an infection in his groin due to redness and swelling in this groin for the last 2 weeks. Denies fevers, chills, N/V, CP or SOB.

## 2022-04-25 NOTE — Discharge Instructions (Signed)
Please fill the prescription sent to your pharmacy on [address] and use it as recommended.  You can use it twice a day and try to very gently clean the area in between applications of the ointment.  Do not use any other lotions, oils, ointments, or creams.  Please call the office of [facility] on Monday morning to schedule the next available appointment.  We also provided a prescription for an additional antifungal oral medication that you should take 1 week from today if you are still having symptoms.  Assuming that you follow-up with the dermatologist, you can also ask them if you should take it as prescribed, but this way you will have the medication available assuming they want you to go ahead and take it.    Return to the emergency department if you develop new or worsening symptoms that concern you.

## 2022-04-25 NOTE — ED Notes (Signed)
DC instructions given verbally and in writing... understanding voiced, signature obtained.  Pt left in stable condition ambulatory with family member to [place].

## 2022-04-25 NOTE — ED Provider Notes (Addendum)
[facility] Provider Note    Event Date/Time   First MD Initiated Contact with Patient 04/25/22 0413     (approximate)   History   Groin Pain   HPI  Vincent Huffman is a 46 y.o. male who presents for ration of pain in his groin and scrotum.  He said that symptoms he will go days without showering, and then a few weeks ago he developed a red itching and burning rash on both sides of his groin and his scrotum.  He tried using various essential oils and other nonprescription remedies.  He did not try any antifungal ointment.  The situation Getting worse and went from the sides of his legs and his groin to cover his entire scrotum.  Then last night he was having a lot of burning and pain so he applied calamine lotion to his scrotum.  That caused a great deal of more severe pain and burning.  He tried washing it off and then applying some zinc oxide ointment as well as an over-the-counter anti-itch medicine, but that did not seem to help either.  He arrives appearing very uncomfortable, unable to find a position of comfort, and occasionally yelling out or screaming in pain.  He denies dysuria, nausea, vomiting, fever, chest pain, shortness of breath, abdominal pain.  He reports that he is not sexually active.  He states that this is never happened to him before.     Physical Exam   Triage Vital Signs: ED Triage Vitals  Enc Vitals Group     BP 04/25/22 0411 (!) 146/91     Pulse Rate 04/25/22 0411 63     Resp 04/25/22 0411 18     Temp 04/25/22 0411 97.7 F (36.5 C)     Temp Source 04/25/22 0411 Oral     SpO2 04/25/22 0411 98 %     Weight 04/25/22 0410 90.7 kg (200 lb)     Height 04/25/22 0410 1.829 m (6')     Head Circumference --      Peak Flow --      Pain Score 04/25/22 0409 10     Pain Loc --      Pain Edu? --      Excl. in [place]? --     Most recent vital signs: Vitals:   04/25/22 0500 04/25/22 0522  BP: 134/80 134/80  Pulse: (!) 52 78  Resp: 20 20   Temp: 98 F (36.7 C) 98.1 F (36.7 C)  SpO2: 99% 99%     General: Awake, appears extremely uncomfortable. CV:  Good peripheral perfusion.  Normal rate regular rhythm. Resp:  Normal effort.  Abd:  No distention.  Other:  His scrotum is scarlet red with a generalized rash that extends out to both sides of his thighs and both groin areas.  As the rash moves away from the scrotum to the periphery, there are clearly visible satellite lesions.  There is no crepitus, no edema, no penile lesions, no herpetic lesions, no canker, no purulent discharge from the urethral meatus.  There is no pus.  Overall it appears consistent with a bad fungal infection and irritation, essentially a candidal dermatitis, possibly with a degree of overlying bacterial infection but without clear evidence of it.  His testes are not tender but the skin itself is exquisitely tender. No involvement of the perineum.  No boils, no open lesions, no purulence, no ecchymotic areas, no crepitus, no findings to suggest Fournier's gangrene or even scrotal  cellulitis.   ED Results / Procedures / Treatments   Labs (all labs ordered are listed, but only abnormal results are displayed) Labs Reviewed - No data to display    PROCEDURES:  Critical Care performed: No  Procedures   MEDICATIONS ORDERED IN ED: Medications  lidocaine (XYLOCAINE) 2 % viscous mouth solution 15 mL (15 mLs Mouth/Throat Given 04/25/22 0505)  droperidol (INAPSINE) 2.5 MG/ML injection 2.5 mg (2.5 mg Intravenous Given 04/25/22 0515)  oxyCODONE-acetaminophen (PERCOCET/ROXICET) 5-325 MG per tablet 2 tablet (2 tablets Oral Given 04/25/22 0510)  fluconazole (DIFLUCAN) tablet 150 mg (150 mg Oral Given 04/25/22 0509)     IMPRESSION / MDM / ASSESSMENT AND PLAN / ED COURSE  I reviewed the triage vital signs and the nursing notes.                              Differential diagnosis includes, but is not limited to, candidal dermatitis, scrotal cellulitis,  testicular torsion, epididymitis, orchitis, Fournier's gangrene.  Patient's presentation is most consistent with acute, uncomplicated illness.  Although the patient is in a great deal amount of discomfort, I think a lot of this was because of a simmering fungal infection that has been going on for weeks.  He then applied calamine lotion to it, which can have a skin irritating effect, and then applied another ointment or cream of unclear contents.  The acute, severe pain began immediately after he applied the calamine lotion, and then repeatedly and thoroughly scrubbed his scrotum with multiple soaps.  Although I approached the exam with a high degree of suspicion during my investigation for any concern of scrotal cellulitis or Fournier's gangrene, I am reassured by the clinical picture even though he is in a great deal of discomfort.  He has no fever, no tachycardia, he is normotensive after receiving analgesia (he was initially hypertensive, not hypotensive), no signs or symptoms of sepsis, no signs or symptoms of infection other than localized at the scrotum and groin.  He is not diabetic, he has had not recent trauma, "boils", ingrown hairs, or other potential nidus for infection.  He has no penile or scrotal edema, and there is no involvement of the perineum. Additionally, the rash is very similar to a bad fungal diaper rash and its distribution and with peripheral satellite lesions.  There is no indication for imaging or lab work at this time.  I provided viscous lidocaine and his nurse applied to the area for some immediate relief even though I warned him that it would likely burn a little bit more initially.  Given the degree of pain he is reporting in spite of his normal vital signs, I ordered 2 Percocet and a dose of droperidol 2.5 mg IV (I verified on an old EKG that he has no history of QTc prolongation) to help as an adjunct to the Percocet and to also help with a calming agent.  I wrote a  prescription for mupirocin/hydrocortisone/nystatin/zinc oxide ointment for him to apply and I gave him a dose of fluconazole 150 mg by mouth given the extent of the fungal infection.  I also wrote him a prescription for a second tablet to take in 1 week.  I strongly encouraged him to follow-up with dermatology at the next available appointment which would be in 2 days (it is currently early in the morning on Saturday).  He said that he understands the plan.  His aunt is with him and  will take him home and help him with the follow-up process.  I gave strict return precautions should he develop new or worsening symptoms.       FINAL CLINICAL IMPRESSION(S) / ED DIAGNOSES   Final diagnoses:  Candidal dermatitis  Adverse effect of drug, initial encounter     Rx / DC Orders   ED Discharge Orders          Ordered    fluconazole (DIFLUCAN) 150 MG tablet   Once        04/25/22 0515    mupirocin 2% oint-hydrocortisone 2.5% cream-nystatin cream-zinc oxide 13% oint 1:1:1:5 mixture  2 times daily        04/25/22 1017             Note:  This document was prepared using Dragon voice recognition software and may include unintentional dictation errors.   Loleta Rose, MD 04/25/22 5102    Loleta Rose, MD 04/25/22 [phone]

## 2022-04-26 ENCOUNTER — Telehealth: Payer: BC Managed Care – PPO | Admitting: Family

## 2022-04-26 DIAGNOSIS — B372 Candidiasis of skin and nail: Secondary | ICD-10-CM

## 2022-04-26 MED ORDER — NYSTATIN 100000 UNIT/GM EX POWD: 1.0000 | Freq: Three times a day (TID) | CUTANEOUS | 0 refills | Status: DC

## 2022-04-26 MED ORDER — BUSPIRONE HCL 5 MG PO TABS: 5.0000 mg | ORAL_TABLET | Freq: Three times a day (TID) | ORAL | 0 refills | Status: DC

## 2022-04-26 MED ORDER — NYSTATIN 100000 UNIT/GM EX CREA: 1.0000 | TOPICAL_CREAM | Freq: Two times a day (BID) | CUTANEOUS | 0 refills | Status: DC

## 2022-04-26 NOTE — Progress Notes (Signed)
E Visit for Rash  We are sorry that you are not feeling well. Here is how we plan to help!   Based upon your presentation it appears you have a fungal infection.  I have prescribed: and Nystatin cream apply to the affected area twice daily and nystatin powder you will apply four times a day.    HOME CARE:  Take cool showers and avoid direct sunlight. Apply cool compress or wet dressings. Take a bath in an oatmeal bath.  Sprinkle content of one Aveeno packet under running faucet with comfortably warm water.  Bathe for 15-20 minutes, 1-2 times daily.  Pat dry with a towel. Do not rub the rash. Use hydrocortisone cream. Take an antihistamine like Benadryl for widespread rashes that itch.  The adult dose of Benadryl is 25-50 mg by mouth 4 times daily. Caution:  This type of medication may cause sleepiness.  Do not drink alcohol, drive, or operate dangerous machinery while taking antihistamines.  Do not take these medications if you have prostate enlargement.  Read package instructions thoroughly on all medications that you take.  GET HELP RIGHT AWAY IF:  Symptoms don't go away after treatment. Severe itching that persists. If you rash spreads or swells. If you rash begins to smell. If it blisters and opens or develops a yellow-brown crust. You develop a fever. You have a sore throat. You become short of breath.  MAKE SURE YOU:  Understand these instructions. Will watch your condition. Will get help right away if you are not doing well or get worse.  Thank you for choosing an e-visit.  Your e-visit answers were reviewed by a board certified advanced clinical practitioner to complete your personal care plan. Depending upon the condition, your plan could have included both over the counter or prescription medications.  Please review your pharmacy choice. Make sure the pharmacy is open so you can pick up prescription now. If there is a problem, you may contact your provider through  [employer] and have the prescription routed to another pharmacy.  Your safety is important to [place]. If you have drug allergies check your prescription carefully.   For the next 24 hours you can use MyChart to ask questions about today's visit, request a non-urgent call back, or ask for a work or school excuse. You will get an email in the next two days asking about your experience. I hope that your e-visit has been valuable and will speed your recovery.  Approximately 5 minutes was spent documenting and reviewing patient's chart.

## 2022-04-26 NOTE — Addendum Note (Signed)
Addended by: Evelina Dun A on: 04/26/2022 12:01 PM   Modules accepted: Orders

## 2022-05-11 ENCOUNTER — Encounter: Payer: Self-pay | Admitting: Nurse Practitioner

## 2022-05-13 NOTE — Telephone Encounter (Signed)
Please see

## 2022-05-26 ENCOUNTER — Telehealth: Payer: BC Managed Care – PPO | Admitting: Physician Assistant

## 2022-05-26 DIAGNOSIS — B356 Tinea cruris: Secondary | ICD-10-CM

## 2022-05-26 MED ORDER — TERBINAFINE HCL 250 MG PO TABS: 250.0000 mg | ORAL_TABLET | Freq: Every day | ORAL | 0 refills | Status: AC

## 2022-05-26 MED ORDER — HYDROXYZINE PAMOATE 25 MG PO CAPS: 25.0000 mg | ORAL_CAPSULE | Freq: Three times a day (TID) | ORAL | 0 refills | Status: DC | PRN

## 2022-05-26 NOTE — Progress Notes (Signed)
E-Visit for [employer]  We are sorry that you are not feeling well. Here is how we plan to help!  Based on what you shared with me it looks like you have tinea cruris, or "Jock Itch".  The symptoms of Jock Itch include red, peeling, itchy rash that affects the groin (crease where the leg meets the trunk).  This fungal infection can be spread through shared towels, clothing, bedding, or hard surfaces (particularly in moist areas) such as shower stalls, locker room floors, or pool area that has the fungus present. If you have a fungal infection on one part of your body, you can also spread it to other parts. For instance, men with a fungal infection on their feet sometimes spread it to their groin.  Prescription medications are only indicated for an extensive rash or if over the counter treatments have failed.  I am prescribing:Terbinafine 250 mg once per day for one week. I am also sending in a prescription for Hydroxyzine to help with itch. Please follow-up with the Dermatologist as scheduled. I recommend calling their office to have them put you on a cancellation list to hopefully get you in sooner!  HOME CARE:  Keep affected area clean, dry, and cool. Wash with soap and shampoo after sports or exercise and dry yourself well after bathing or swimming Wear cotton underwear and change them if they become damp or sweaty. Avoid using swimming pools, public showers, or baths.  GET HELP RIGHT AWAY IF:  Symptoms that don't away after treatment. Severe itching that persists. If your rash spreads or swells. If your rash begins to have drainage or smell. You develop a fever.  MAKE SURE YOU   Understand these instructions. Will watch your condition. Will get help right away if you are not doing well or get worse.  Thank you for choosing an e-visit.  Your e-visit answers were reviewed by a board certified advanced clinical practitioner to complete your personal care plan. Depending upon the  condition, your plan could have included both over the counter or prescription medications.  Please review your pharmacy choice. Make sure the pharmacy is open so you can pick up prescription now. If there is a problem, you may contact your provider through [employer] and have the prescription routed to another pharmacy.  Your safety is important to [place]. If you have drug allergies check your prescription carefully.   For the next 24 hours you can use MyChart to ask questions about today's visit, request a non-urgent call back, or ask for a work or school excuse. You will get an email in the next two days asking about your experience. I hope that your e-visit has been valuable and will speed your recovery.   References or for more information:  [employer]hu https://hebert-johnson.[email or web address] BetaTrainer.de?search=[email or web address]&source=search_result&selectedTitle=3~52&usage_type=default&display_rank=3

## 2022-05-26 NOTE — Progress Notes (Signed)
I have spent 5 minutes in review of e-visit questionnaire, review and updating patient chart, medical decision making and response to patient.   Ernestyne Caldwell Cody Kailer Heindel, PA-C    

## 2022-07-16 ENCOUNTER — Ambulatory Visit (INDEPENDENT_AMBULATORY_CARE_PROVIDER_SITE_OTHER): Payer: BC Managed Care – PPO | Admitting: Nurse Practitioner

## 2022-07-16 ENCOUNTER — Encounter: Payer: Self-pay | Admitting: Nurse Practitioner

## 2022-07-16 VITALS — BP 124/81 | HR 90 | Temp 98.3°F | Resp 16 | Ht 72.0 in | Wt 220.4 lb

## 2022-07-16 DIAGNOSIS — R5383 Other fatigue: Secondary | ICD-10-CM

## 2022-07-16 DIAGNOSIS — E559 Vitamin D deficiency, unspecified: Secondary | ICD-10-CM

## 2022-07-16 DIAGNOSIS — E538 Deficiency of other specified B group vitamins: Secondary | ICD-10-CM

## 2022-07-16 DIAGNOSIS — L649 Androgenic alopecia, unspecified: Secondary | ICD-10-CM

## 2022-07-16 DIAGNOSIS — E782 Mixed hyperlipidemia: Secondary | ICD-10-CM

## 2022-07-16 DIAGNOSIS — R635 Abnormal weight gain: Secondary | ICD-10-CM | POA: Diagnosis not present

## 2022-07-16 NOTE — Progress Notes (Signed)
[facility] [place], [place] [address]  Internal MEDICINE  Office Visit Note  Patient Name: Vincent Huffman  [id number]  [id number]  Date of Service: 07/16/2022  Chief Complaint  Patient presents with   Follow-up   Gastroesophageal Reflux    HPI Vincent Huffman presents for a follow up visit for fatigue, low energy and hair loss.  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 Increased fatigue and low energy  Increased hunger Trouble sleeping      Current Medication: Outpatient Encounter Medications as of 07/16/2022  Medication Sig   baclofen (LIORESAL) 10 MG tablet Take 1 tablet (10 mg total) by mouth 3 (three) times daily.   busPIRone (BUSPAR) 5 MG tablet Take 1 tablet (5 mg total) by mouth 3 (three) times daily.   carisoprodol (SOMA) 350 MG tablet Take 1 tablet (350 mg total) by mouth daily as needed for muscle spasms.   cyclobenzaprine (FLEXERIL) 10 MG tablet Take 1 tablet (10 mg total) by mouth at bedtime. Take one tab po qhs for back spasm prn only   hydrOXYzine (VISTARIL) 25 MG capsule Take 1 capsule (25 mg total) by mouth every 8 (eight) hours as needed.   mupirocin 2% oint-hydrocortisone 2.5% cream-nystatin cream-zinc oxide 13% oint 1:1:1:5 mixture Apply topically 2 (two) times daily. Discontinue use when symptoms improve.   naproxen (NAPROSYN) 500 MG tablet Take 1 tablet (500 mg total) by mouth 2 (two) times daily with a meal.   nystatin (MYCOSTATIN/NYSTOP) powder Apply 1 Application topically 3 (three) times daily.   nystatin cream (MYCOSTATIN) Apply 1 Application topically 2 (two) times daily.   tizanidine (ZANAFLEX) 2 MG capsule Take 1 capsule (2 mg total) by mouth 3 (three) times daily.   [DISCONTINUED] ranitidine (ZANTAC) 150 MG capsule Take 1 capsule (150 mg total) by mouth 2 (two) times daily.   No facility-administered encounter  medications on file as of 07/16/2022.    Surgical History: Past Surgical History:  Procedure Laterality Date   ADENOIDECTOMY     ESOPHAGOGASTRODUODENOSCOPY N/A 02/28/2020   Procedure: ESOPHAGOGASTRODUODENOSCOPY (EGD);  Surgeon: Toledo, Benay Pike, MD;  Location: [facility] ENDOSCOPY;  Service: Gastroenterology;  Laterality: N/A;   TONSILLECTOMY      Medical History: Past Medical History:  Diagnosis Date   Chronic reflux esophagitis    Degenerative joint disease (DJD) of lumbar spine    Esophagitis    GERD (gastroesophageal reflux disease)     Family History: Family History  Problem Relation Age of Onset   Heart disease Mother    Heart disease Father    Diabetes Maternal Grandfather     Social History   Socioeconomic History   Marital status: Single    Spouse name: Not on file   Number of children: Not on file   Years of education: Not on file   Highest education level: Not on file  Occupational History   Occupation: Working Clinical biochemist   Tobacco Use   Smoking status: Never   Smokeless tobacco: Never  Vaping Use   Vaping Use: Never used  Substance and Sexual Activity   Alcohol use: No   Drug use: No   Sexual activity: Not on file  Other Topics Concern   Not on file  Social History Narrative   Not on file   Social Determinants of Health   Financial Resource Strain: High Risk (01/11/2018)   Overall Financial Resource Strain (CARDIA)  Difficulty of Paying Living Expenses: Hard  Food Insecurity: Food Insecurity Present (01/11/2018)   Hunger Vital Sign    Worried About Running Out of Food in the Last Year: Sometimes true    Ran Out of Food in the Last Year: Sometimes true  Transportation Needs: No Transportation Needs (01/11/2018)   PRAPARE - Hydrologist (Medical): No    Lack of Transportation (Non-Medical): No  Physical Activity: Sufficiently Active (01/11/2018)   Exercise Vital Sign    Days of Exercise per Week: 5 days    Minutes of  Exercise per Session: 60 min  Stress: No Stress Concern Present (01/11/2018)   [place]    Feeling of Stress : Only a little  Social Connections: Moderately Isolated (01/11/2018)   Social Connection and Isolation Panel [NHANES]    Frequency of Communication with Friends and Family: Once a week    Frequency of Social Gatherings with Friends and Family: Once a week    Attends Religious Services: More than 4 times per year    Active Member of [employer] or Organizations: No    Attends Archivist Meetings: Never    Marital Status: Never married  Intimate Partner Violence: Unknown (01/11/2018)   Humiliation, Afraid, Rape, and Kick questionnaire    Fear of Current or Ex-Partner: Patient refused    Emotionally Abused: Patient refused    Physically Abused: Patient refused    Sexually Abused: Patient refused      Review of Systems  Constitutional:  Negative for chills, fatigue and unexpected weight change.  HENT:  Negative for congestion, rhinorrhea, sneezing and sore throat.        Hair loss  Eyes:  Negative for redness.  Respiratory: Negative.  Negative for apnea, cough, chest tightness, shortness of breath and wheezing.   Cardiovascular: Negative.  Negative for chest pain and palpitations.  Gastrointestinal:  Negative for abdominal pain, constipation, diarrhea, nausea and vomiting.  Genitourinary:  Negative for dysuria and frequency.  Musculoskeletal:  Positive for arthralgias, back pain and neck pain. Negative for joint swelling.  Skin:  Negative for rash.  Neurological: Negative.  Negative for tremors and numbness.  Hematological:  Negative for adenopathy. Does not bruise/bleed easily.  Psychiatric/Behavioral:  Negative for behavioral problems (Depression), sleep disturbance and suicidal ideas. The patient is not nervous/anxious.     Vital Signs: BP 124/81   Pulse 90   Temp 98.3 F (36.8 C)   Resp 16   Ht 6'  (1.829 m)   Wt 220 lb 6.4 oz (100 kg)   SpO2 96%   BMI 29.89 kg/m    Physical Exam Vitals reviewed.  Constitutional:      General: He is not in acute distress.    Appearance: Normal appearance. He is not ill-appearing.  HENT:     Head: Normocephalic and atraumatic.  Eyes:     Pupils: Pupils are equal, round, and reactive to light.  Cardiovascular:     Rate and Rhythm: Normal rate and regular rhythm.  Pulmonary:     Effort: Pulmonary effort is normal. No respiratory distress.  Neurological:     Mental Status: He is alert and oriented to person, place, and time.     Cranial Nerves: No cranial nerve deficit.     Coordination: Coordination normal.     Gait: Gait normal.  Psychiatric:        Mood and Affect: Mood normal.  Behavior: Behavior normal.        Assessment/Plan: 1. 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 - TSH + free T4 - Hgb A1C w/o eAG - Vitamin D (25 hydroxy) - B12 and Folate Panel  3. Other fatigue Additional labs ordered - TSH + free T4 - CMP14+EGFR - Hgb A1C w/o eAG - Iron, TIBC and Ferritin Panel - Vitamin D (25 hydroxy) - B12 and Folate Panel - Lipid Profile  4. B12 deficiency Repeat B12 and iron panel - TSH + free T4 - Iron, TIBC and Ferritin Panel - B12 and Folate Panel  5. Mixed hyperlipidemia Repeat cholesterol panel - Lipid Profile  6. Vitamin D deficiency Repeat vitamin D level - Vitamin D (25 hydroxy)   General Counseling: Mabry verbalizes understanding of the findings of todays visit and agrees with plan of treatment. I have discussed any further diagnostic evaluation that may be needed or ordered today. We also reviewed his medications today. he has been encouraged to  call the office with any questions or concerns that should arise related to todays visit.    Orders Placed This Encounter  Procedures   TSH + free T4   CMP14+EGFR   Hgb A1C w/o eAG   Iron, TIBC and Ferritin Panel   Vitamin D (25 hydroxy)   B12 and Folate Panel   Lipid Profile    No orders of the defined types were placed in this encounter.   Return if symptoms worsen or fail to improve.   Total time spent:30 Minutes Time spent includes review of chart, medications, test results, and follow up plan with the patient.   [place] Controlled Substance Database was reviewed by me.  This patient was seen by Jonetta Osgood, FNP-C in collaboration with Dr. Clayborn Bigness as a part of collaborative care agreement.   Jensyn Cambria R. Valetta Fuller, MSN, FNP-C Internal medicine

## 2022-07-21 LAB — B12 AND FOLATE PANEL
Folate: 11.3 ng/mL (ref >3.0)
Vitamin B-12: 1044 pg/mL (ref 232–1245)

## 2022-07-21 LAB — CMP14+EGFR
ALT: 31 IU/L (ref 0–44)
AST: 25 IU/L (ref 0–40)
Albumin/Globulin Ratio: 2.1 (ref 1.2–2.2)
Albumin: 4.6 g/dL (ref 4.1–5.1)
Alkaline Phosphatase: 62 IU/L (ref 44–121)
BUN/Creatinine Ratio: 13 (ref 9–20)
BUN: 12 mg/dL (ref 6–24)
Bilirubin Total: 0.5 mg/dL (ref 0.0–1.2)
CO2: 25 mmol/L (ref 20–29)
Calcium: 9.7 mg/dL (ref 8.7–10.2)
Chloride: 100 mmol/L (ref 96–106)
Creatinine, Ser: 0.94 mg/dL (ref 0.76–1.27)
Globulin, Total: 2.2 g/dL (ref 1.5–4.5)
Glucose: 85 mg/dL (ref 70–99)
Potassium: 4.5 mmol/L (ref 3.5–5.2)
Sodium: 140 mmol/L (ref 134–144)
Total Protein: 6.8 g/dL (ref 6.0–8.5)
eGFR: 102 mL/min/{1.73_m2} (ref >59)

## 2022-07-21 LAB — LIPID PANEL
Chol/HDL Ratio: 4.5 ratio (ref 0.0–5.0)
Cholesterol, Total: 179 mg/dL (ref 100–199)
HDL: 40 mg/dL (ref >39)
LDL Chol Calc (NIH): 109 mg/dL — ABNORMAL HIGH (ref 0–99)
Triglycerides: 169 mg/dL — ABNORMAL HIGH (ref 0–149)
VLDL Cholesterol Cal: 30 mg/dL (ref 5–40)

## 2022-07-21 LAB — IRON,TIBC AND FERRITIN PANEL
Ferritin: 235 ng/mL (ref 30–400)
Iron Saturation: 20 % (ref 15–55)
Iron: 62 ug/dL (ref 38–169)
Total Iron Binding Capacity: 314 ug/dL (ref 250–450)
UIBC: 252 ug/dL (ref 111–343)

## 2022-07-21 LAB — TSH+FREE T4
Free T4: 1.07 ng/dL (ref 0.82–1.77)
TSH: 3.05 u[IU]/mL (ref 0.450–4.500)

## 2022-07-21 LAB — VITAMIN D 25 HYDROXY (VIT D DEFICIENCY, FRACTURES): Vit D, 25-Hydroxy: 33.8 ng/mL (ref 30.0–100.0)

## 2022-07-21 LAB — HGB A1C W/O EAG: Hgb A1c MFr Bld: 5.4 % (ref 4.8–5.6)

## 2022-09-18 ENCOUNTER — Encounter: Payer: Self-pay | Admitting: Nurse Practitioner

## 2022-09-18 ENCOUNTER — Ambulatory Visit (INDEPENDENT_AMBULATORY_CARE_PROVIDER_SITE_OTHER): Payer: BC Managed Care – PPO | Admitting: Nurse Practitioner

## 2022-09-18 VITALS — BP 125/76 | HR 65 | Temp 98.0°F | Resp 16 | Ht 72.0 in | Wt 222.2 lb

## 2022-09-18 DIAGNOSIS — N62 Hypertrophy of breast: Secondary | ICD-10-CM | POA: Diagnosis not present

## 2022-09-18 DIAGNOSIS — G44209 Tension-type headache, unspecified, not intractable: Secondary | ICD-10-CM

## 2022-09-18 DIAGNOSIS — K219 Gastro-esophageal reflux disease without esophagitis: Secondary | ICD-10-CM

## 2022-09-18 DIAGNOSIS — L649 Androgenic alopecia, unspecified: Secondary | ICD-10-CM

## 2022-09-18 DIAGNOSIS — R5383 Other fatigue: Secondary | ICD-10-CM

## 2022-09-18 MED ORDER — RIZATRIPTAN BENZOATE 5 MG PO TABS: 5.0000 mg | ORAL_TABLET | ORAL | 3 refills | Status: DC | PRN

## 2022-09-18 MED ORDER — RABEPRAZOLE SODIUM 20 MG PO TBEC: 20.0000 mg | DELAYED_RELEASE_TABLET | Freq: Every day | ORAL | 2 refills | Status: DC

## 2022-09-18 NOTE — Progress Notes (Signed)
[facility] [place], [place] [address]  Internal MEDICINE  Office Visit Note  Patient Name: Vincent Huffman  [id number]  [id number]  Date of Service: 09/18/2022  Chief Complaint  Patient presents with   Acute Visit    headache     HPI Vincent Huffman presents for an acute sick visit for headache, fatigue, poor sleep, acid reflux Headache x3 days, frontal or occipital -- aspirin and soma did not help. Pseudofed helps a little. Ibuprofen or tylenol did not help much either.  Fatigue- ongoing -- still having problem and poor sleep.  Esophagus problems -- pepcid is helping. Requesting aciphex.     Current Medication:  Outpatient Encounter Medications as of 09/18/2022  Medication Sig   baclofen (LIORESAL) 10 MG tablet Take 1 tablet (10 mg total) by mouth 3 (three) times daily.   busPIRone (BUSPAR) 5 MG tablet Take 1 tablet (5 mg total) by mouth 3 (three) times daily.   carisoprodol (SOMA) 350 MG tablet Take 1 tablet (350 mg total) by mouth daily as needed for muscle spasms.   cyclobenzaprine (FLEXERIL) 10 MG tablet Take 1 tablet (10 mg total) by mouth at bedtime. Take one tab po qhs for back spasm prn only   hydrOXYzine (VISTARIL) 25 MG capsule Take 1 capsule (25 mg total) by mouth every 8 (eight) hours as needed.   mupirocin 2% oint-hydrocortisone 2.5% cream-nystatin cream-zinc oxide 13% oint 1:1:1:5 mixture Apply topically 2 (two) times daily. Discontinue use when symptoms improve.   naproxen (NAPROSYN) 500 MG tablet Take 1 tablet (500 mg total) by mouth 2 (two) times daily with a meal.   nystatin (MYCOSTATIN/NYSTOP) powder Apply 1 Application topically 3 (three) times daily.   nystatin cream (MYCOSTATIN) Apply 1 Application topically 2 (two) times daily.   RABEprazole (ACIPHEX) 20 MG tablet Take 1 tablet (20 mg total) by mouth daily.   rizatriptan (MAXALT) 5 MG tablet Take 1 tablet (5 mg total) by mouth as needed (tension headache). May repeat in 2 hours if needed    tizanidine (ZANAFLEX) 2 MG capsule Take 1 capsule (2 mg total) by mouth 3 (three) times daily.   [DISCONTINUED] ranitidine (ZANTAC) 150 MG capsule Take 1 capsule (150 mg total) by mouth 2 (two) times daily.   No facility-administered encounter medications on file as of 09/18/2022.      Medical History: Past Medical History:  Diagnosis Date   Chronic reflux esophagitis    Degenerative joint disease (DJD) of lumbar spine    Esophagitis    GERD (gastroesophageal reflux disease)      Vital Signs: BP 125/76   Pulse 65   Temp 98 F (36.7 C)   Resp 16   Ht 6' (1.829 m)   Wt 222 lb 3.2 oz (100.8 kg)   SpO2 98%   BMI 30.14 kg/m    Review of Systems  Constitutional:  Positive for fatigue.  HENT: Negative.    Respiratory: Negative.  Negative for cough, chest tightness, shortness of breath and wheezing.   Cardiovascular: Negative.  Negative for chest pain and palpitations.  Gastrointestinal:  Positive for nausea.       Acid reflux  Musculoskeletal:  Positive for arthralgias and back pain.  Neurological:  Positive for headaches.    Physical Exam Vitals reviewed.  Constitutional:      General: He is not in acute distress.    Appearance: Normal appearance. He is obese. He is not ill-appearing.  HENT:     Head: Normocephalic and atraumatic.  Eyes:     Pupils: Pupils are equal, round, and reactive to light.  Cardiovascular:     Rate and Rhythm: Normal rate and regular rhythm.  Pulmonary:     Effort: Pulmonary effort is normal. No respiratory distress.  Neurological:     Mental Status: He is alert and oriented to person, place, and time.  Psychiatric:        Mood and Affect: Mood normal.        Behavior: Behavior normal.       Assessment/Plan: 1. Tension headache Will try rizatriptan to help with acute headaches.  - rizatriptan (MAXALT) 5 MG tablet; Take 1 tablet (5 mg total) by mouth as needed (tension headache). May repeat in 2 hours if needed  Dispense: 10  tablet; Refill: 3  2. Gastroesophageal reflux disease without esophagitis Requested medication prescribed to help with acid reflux  - RABEprazole (ACIPHEX) 20 MG tablet; Take 1 tablet (20 mg total) by mouth daily.  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 Gynecomastia, male Labs ordered - Testosterone,Free and Total - Estradiol - FSH/LH - Prolactin  5. Other fatigue Labs ordered - Testosterone,Free and Total - Estradiol - FSH/LH - Prolactin   General Counseling: Vincent Huffman verbalizes understanding of the findings of todays visit and agrees with plan of treatment. I have discussed any further diagnostic evaluation that may be needed or ordered today. We also reviewed his medications today. he has been encouraged to call the office with any questions or concerns that should arise related to todays visit.    Counseling:    Orders Placed This Encounter  Procedures   Testosterone,Free and Total   Estradiol   FSH/LH   Prolactin    Meds ordered this encounter  Medications   rizatriptan (MAXALT) 5 MG tablet    Sig: Take 1 tablet (5 mg total) by mouth as needed (tension headache). May repeat in 2 hours if needed    Dispense:  10 tablet    Refill:  3    New med fill today   RABEprazole (ACIPHEX) 20 MG tablet    Sig: Take 1 tablet (20 mg total) by mouth daily.    Dispense:  30 tablet    Refill:  2    Fill script today    Return if symptoms worsen or fail to improve. Has upcoming appointment in June   Vincent Huffman Controlled Substance Database was reviewed by me for overdose risk score (ORS)  Time spent:30 Minutes Time spent with patient included reviewing progress notes, labs, imaging studies, and discussing plan for follow up.   This patient was seen by Jonetta Osgood, FNP-C in collaboration with Dr. Clayborn Bigness as a part of collaborative care  agreement.  Vincent Huffman R. Valetta Fuller, MSN, FNP-C Internal Medicine

## 2022-09-27 LAB — FSH/LH
FSH: 2.8 m[IU]/mL (ref 1.5–12.4)
LH: 5.3 m[IU]/mL (ref 1.7–8.6)

## 2022-09-27 LAB — PROLACTIN: Prolactin: 8.4 ng/mL (ref 3.9–22.7)

## 2022-09-27 LAB — ESTRADIOL: Estradiol: 21.8 pg/mL (ref 7.6–42.6)

## 2022-09-27 LAB — TESTOSTERONE,FREE AND TOTAL
Testosterone, Free: 5.2 pg/mL — ABNORMAL LOW (ref 6.8–21.5)
Testosterone: 243 ng/dL — ABNORMAL LOW (ref 264–916)

## 2022-10-09 ENCOUNTER — Other Ambulatory Visit: Payer: Self-pay

## 2022-10-09 ENCOUNTER — Emergency Department
Admission: EM | Admit: 2022-10-09 | Discharge: 2022-10-09 | Disposition: A | Discharge status: Home / Self Care | Payer: BC Managed Care – PPO | Attending: Emergency Medicine | Admitting: Emergency Medicine

## 2022-10-09 ENCOUNTER — Emergency Department: Payer: BC Managed Care – PPO

## 2022-10-09 DIAGNOSIS — T18128A Food in esophagus causing other injury, initial encounter: Secondary | ICD-10-CM | POA: Insufficient documentation

## 2022-10-09 DIAGNOSIS — W44F3XA Food entering into or through a natural orifice, initial encounter: Secondary | ICD-10-CM | POA: Insufficient documentation

## 2022-10-09 LAB — BASIC METABOLIC PANEL
Anion gap: 5 (ref 5–15)
BUN: 10 mg/dL (ref 6–20)
CO2: 27 mmol/L (ref 22–32)
Calcium: 9 mg/dL (ref 8.9–10.3)
Chloride: 105 mmol/L (ref 98–111)
Creatinine, Ser: 0.89 mg/dL (ref 0.61–1.24)
GFR, Estimated: >60 mL/min (ref >60)
Glucose, Bld: 105 mg/dL — ABNORMAL HIGH (ref 70–99)
Potassium: 4.2 mmol/L (ref 3.5–5.1)
Sodium: 137 mmol/L (ref 135–145)

## 2022-10-09 LAB — CBC
HCT: 41.1 % (ref 39.0–52.0)
Hemoglobin: 13.7 g/dL (ref 13.0–17.0)
MCH: 30.2 pg (ref 26.0–34.0)
MCHC: 33.3 g/dL (ref 30.0–36.0)
MCV: 90.5 fL (ref 80.0–100.0)
Platelets: 227 10*3/uL (ref 150–400)
RBC: 4.54 MIL/uL (ref 4.22–5.81)
RDW: 12.5 % (ref 11.5–15.5)
WBC: 5.7 10*3/uL (ref 4.0–10.5)
nRBC: 0 % (ref 0.0–0.2)

## 2022-10-09 SURGERY — ESOPHAGOGASTRODUODENOSCOPY (EGD) WITH PROPOFOL
Anesthesia: General

## 2022-10-09 MED ORDER — PANTOPRAZOLE SODIUM 40 MG IV SOLR
40.0000 mg | Freq: Once | INTRAVENOUS | Status: AC
  Administered 2022-10-09: 40 mg via INTRAVENOUS
  Filled 2022-10-09: qty 10

## 2022-10-09 MED ORDER — OMEPRAZOLE MAGNESIUM 20 MG PO TBEC: 40.0000 mg | DELAYED_RELEASE_TABLET | Freq: Two times a day (BID) | ORAL | 0 refills | Status: DC

## 2022-10-09 MED ORDER — GLUCAGON HCL (RDNA) 1 MG IJ SOLR
1.0000 mg | Freq: Once | INTRAMUSCULAR | Status: AC
  Administered 2022-10-09: 1 mg via INTRAVENOUS
  Filled 2022-10-09: qty 1

## 2022-10-09 NOTE — ED Triage Notes (Signed)
Pt presents to ED via POV with c/o of having a food bolus stuck, pt states he ate steak and has been vomiting since. Pt states HX of same and needed EGD to remove food. Pt currently vomiting in triage. NAD noted.

## 2022-10-09 NOTE — ED Provider Notes (Signed)
[facility] Provider Note    Event Date/Time   First MD Initiated Contact with Patient 10/09/22 1308     (approximate)   History   No chief complaint on file.   HPI  Vincent Huffman is a 47 y.o. male presents to the emergency department with inability to swallow.  Patient states that he ate a steak at 930 this morning and has not able to tolerate his secretions since that time.  Has attempted to drink some fluid but that comes immediately back up.  This has happened multiple years ago in the past and states that he had had an endoscopy done and was told he had severe esophagitis.  Does not believe he has had prior esophageal dilations.  States that he has not been taking care of himself lately with a lot of stress, working too much and not eating well.  Denies any alcohol use or marijuana use.  Denies any chest pain or shortness of breath.     Physical Exam   Triage Vital Signs: ED Triage Vitals  Enc Vitals Group     BP 10/09/22 1303 126/88     Pulse Rate 10/09/22 1303 67     Resp 10/09/22 1303 18     Temp 10/09/22 1303 98.2 F (36.8 C)     Temp Source 10/09/22 1303 Oral     SpO2 10/09/22 1303 98 %     Weight 10/09/22 1303 200 lb (90.7 kg)     Height --      Head Circumference --      Peak Flow --      Pain Score 10/09/22 1305 2     Pain Loc --      Pain Edu? --      Excl. in [place]? --     Most recent vital signs: Vitals:   10/09/22 1303  BP: 126/88  Pulse: 67  Resp: 18  Temp: 98.2 F (36.8 C)  SpO2: 98%    Physical Exam Constitutional:      Appearance: He is well-developed.  HENT:     Head: Atraumatic.     Comments: Spitting into an emesis bag Eyes:     Conjunctiva/sclera: Conjunctivae normal.  Cardiovascular:     Rate and Rhythm: Regular rhythm.  Pulmonary:     Effort: No respiratory distress.  Musculoskeletal:     Cervical back: Normal range of motion.  Skin:    General: Skin is warm.  Neurological:     Mental Status: He is  alert. Mental status is at baseline.      IMPRESSION / MDM / ASSESSMENT AND PLAN / ED COURSE  I reviewed the triage vital signs and the nursing notes.  Differential including food impaction, electrolyte abnormality, gastritis   RADIOLOGY I independently reviewed imaging, my interpretation of imaging: Chest x-ray with no acute findings.  Read as no acute findings.    Labs (all labs ordered are listed, but only abnormal results are displayed) Labs interpreted as -    Labs Reviewed  BASIC METABOLIC PANEL - Abnormal; Notable for the following components:      Result Value   Glucose, Bld 105 (*)    All other components within normal limits  CBC      No significant lab work abnormalities.  No significant anemia.  Consulted and discussed with GI Dr. Marius Ditch who recommended endoscopy given food impaction.  2:14 PM -patient states that he feels like he passed his food impaction.  Now  able to tolerate p.o.  Given a [place]: Able to drink the entire Coke.  Continues to have globus sensation but is able to tolerate p.o.  Remessage Dr. Marius Ditch who recommended discharge home with PPI and outpatient follow-up.  Will start the patient on omeprazole.  Discussed close follow-up with primary care physician and gastroenterology.  Given return precautions.  Discussed a modified diet.    PROCEDURES:  Critical Care performed: No  Procedures  Patient's presentation is most consistent with acute presentation with potential threat to life or bodily function.   MEDICATIONS ORDERED IN ED: Medications  glucagon (GLUCAGEN) injection 1 mg (1 mg Intravenous Given 10/09/22 1332)  pantoprazole (PROTONIX) injection 40 mg (40 mg Intravenous Given 10/09/22 1356)    FINAL CLINICAL IMPRESSION(S) / ED DIAGNOSES   Final diagnoses:  Food impaction of esophagus, initial encounter     Rx / DC Orders   ED Discharge Orders          Ordered    omeprazole (PRILOSEC OTC) 20 MG tablet  2 times daily         10/09/22 1420             Note:  This document was prepared using Dragon voice recognition software and may include unintentional dictation errors.   Nathaniel Man, MD 10/09/22 1421

## 2022-10-09 NOTE — Discharge Instructions (Addendum)
You are seen in the emergency department for food impaction.  You are able to pass it on your own.  If this recurs it is importantly return to the emergency department.  You were started on Prilosec 40 mg twice a day.  Call gastroenterology Dr. Marius Ditch to schedule close follow-up appointment for endoscopy.  Followed dysphagia eating plan.  No steak or bread products.

## 2022-10-12 ENCOUNTER — Telehealth: Payer: Self-pay

## 2022-10-12 ENCOUNTER — Other Ambulatory Visit: Payer: Self-pay

## 2022-10-12 DIAGNOSIS — R1310 Dysphagia, unspecified: Secondary | ICD-10-CM

## 2022-10-12 DIAGNOSIS — Z1211 Encounter for screening for malignant neoplasm of colon: Secondary | ICD-10-CM

## 2022-10-12 MED ORDER — NA SULFATE-K SULFATE-MG SULF 17.5-3.13-1.6 GM/177ML PO SOLN: 354.0000 mL | Freq: Once | ORAL | 0 refills | Status: AC

## 2022-10-12 NOTE — Telephone Encounter (Signed)
-----   Message from Lin Landsman, MD sent at 10/09/2022  2:20 PM EST ----- Regarding: EGD Please schedule an outpt egd for h/o dysphagia, food impaction  RV

## 2022-10-12 NOTE — Telephone Encounter (Signed)
Called and left a message for call back  

## 2022-10-12 NOTE — Telephone Encounter (Signed)
Patient return call and got patient schedule for 10/22/22. He asked if a colonoscopy could be added to the EGD because he has never had one. Dr. Marius Ditch said yes we could do this. Went over instructions and sent to [facility]. Sent prep to the pharmacy

## 2022-10-14 ENCOUNTER — Telehealth: Payer: Self-pay

## 2022-10-14 NOTE — Progress Notes (Signed)
Patient notified

## 2022-10-14 NOTE — Telephone Encounter (Signed)
Left message for patient to give office a call back.  

## 2022-10-15 ENCOUNTER — Encounter: Payer: Self-pay | Admitting: Gastroenterology

## 2022-10-22 ENCOUNTER — Ambulatory Visit: Payer: BC Managed Care – PPO | Admitting: Anesthesiology

## 2022-10-22 ENCOUNTER — Telehealth: Payer: Self-pay

## 2022-10-22 ENCOUNTER — Encounter: Payer: Self-pay | Admitting: Gastroenterology

## 2022-10-22 ENCOUNTER — Other Ambulatory Visit: Payer: Self-pay

## 2022-10-22 ENCOUNTER — Encounter
Admission: RE | Disposition: A | Discharge status: Home / Self Care | Payer: Self-pay | Source: Home / Self Care | Attending: Gastroenterology

## 2022-10-22 ENCOUNTER — Ambulatory Visit
Admission: RE | Admit: 2022-10-22 | Discharge: 2022-10-22 | Disposition: A | Discharge status: Home / Self Care | Payer: BC Managed Care – PPO | Attending: Gastroenterology | Admitting: Gastroenterology

## 2022-10-22 DIAGNOSIS — K2289 Other specified disease of esophagus: Secondary | ICD-10-CM | POA: Insufficient documentation

## 2022-10-22 DIAGNOSIS — K222 Esophageal obstruction: Secondary | ICD-10-CM | POA: Insufficient documentation

## 2022-10-22 DIAGNOSIS — K449 Diaphragmatic hernia without obstruction or gangrene: Secondary | ICD-10-CM

## 2022-10-22 DIAGNOSIS — K219 Gastro-esophageal reflux disease without esophagitis: Secondary | ICD-10-CM | POA: Diagnosis not present

## 2022-10-22 DIAGNOSIS — R1310 Dysphagia, unspecified: Secondary | ICD-10-CM

## 2022-10-22 DIAGNOSIS — K644 Residual hemorrhoidal skin tags: Secondary | ICD-10-CM | POA: Diagnosis not present

## 2022-10-22 DIAGNOSIS — R1314 Dysphagia, pharyngoesophageal phase: Secondary | ICD-10-CM | POA: Insufficient documentation

## 2022-10-22 DIAGNOSIS — Z1211 Encounter for screening for malignant neoplasm of colon: Secondary | ICD-10-CM

## 2022-10-22 HISTORY — DX: Other complications of anesthesia, initial encounter: T88.59XA

## 2022-10-22 HISTORY — PX: ESOPHAGOGASTRODUODENOSCOPY (EGD) WITH PROPOFOL: SHX5813

## 2022-10-22 HISTORY — PX: COLONOSCOPY WITH PROPOFOL: SHX5780

## 2022-10-22 SURGERY — COLONOSCOPY WITH PROPOFOL
Anesthesia: General | Site: Rectum

## 2022-10-22 MED ORDER — ONDANSETRON HCL 4 MG/2ML IJ SOLN: 4.0000 mg | Freq: Once | INTRAMUSCULAR | Status: DC | PRN

## 2022-10-22 MED ORDER — PROPOFOL 10 MG/ML IV BOLUS
INTRAVENOUS | Status: DC | PRN
  Administered 2022-10-22: 60 mg via INTRAVENOUS
  Administered 2022-10-22 (×2): 20 mg via INTRAVENOUS
  Administered 2022-10-22: 60 mg via INTRAVENOUS
  Administered 2022-10-22: 20 mg via INTRAVENOUS
  Administered 2022-10-22: 40 mg via INTRAVENOUS
  Administered 2022-10-22: 60 mg via INTRAVENOUS
  Administered 2022-10-22: 20 mg via INTRAVENOUS

## 2022-10-22 MED ORDER — ACETAMINOPHEN 160 MG/5ML PO SOLN: 325.0000 mg | ORAL | Status: DC | PRN

## 2022-10-22 MED ORDER — STERILE WATER FOR IRRIGATION IR SOLN
Status: DC | PRN
  Administered 2022-10-22: 120 mL

## 2022-10-22 MED ORDER — LACTATED RINGERS IV SOLN: INTRAVENOUS | Status: DC

## 2022-10-22 MED ORDER — STERILE WATER FOR IRRIGATION IR SOLN
Status: DC | PRN
  Administered 2022-10-22: 150 mL

## 2022-10-22 MED ORDER — LIDOCAINE HCL (CARDIAC) PF 100 MG/5ML IV SOSY
PREFILLED_SYRINGE | INTRAVENOUS | Status: DC | PRN
  Administered 2022-10-22: 80 mg via INTRAVENOUS

## 2022-10-22 MED ORDER — ACETAMINOPHEN 325 MG PO TABS: 650.0000 mg | ORAL_TABLET | Freq: Once | ORAL | Status: DC | PRN

## 2022-10-22 MED ORDER — EPHEDRINE SULFATE (PRESSORS) 50 MG/ML IJ SOLN
INTRAMUSCULAR | Status: DC | PRN
  Administered 2022-10-22: 5 mg via INTRAVENOUS

## 2022-10-22 MED ORDER — SODIUM CHLORIDE 0.9 % IV SOLN: INTRAVENOUS | Status: DC

## 2022-10-22 MED ORDER — RABEPRAZOLE SODIUM 20 MG PO TBEC: 20.0000 mg | DELAYED_RELEASE_TABLET | Freq: Two times a day (BID) | ORAL | 2 refills | Status: DC

## 2022-10-22 SURGICAL SUPPLY — 36 items
BALLN DILATOR 12-15 8 (BALLOONS)
BALLN DILATOR 15-18 8 (BALLOONS)
BALLN DILATOR CRE 0-12 8 (BALLOONS)
BALLN DILATOR ESOPH 8 10 CRE (MISCELLANEOUS) IMPLANT
BALLOON DILATOR 12-15 8 (BALLOONS) IMPLANT
BALLOON DILATOR 15-18 8 (BALLOONS) IMPLANT
BALLOON DILATOR CRE 0-12 8 (BALLOONS) IMPLANT
BLOCK BITE 60FR ADLT L/F GRN (MISCELLANEOUS) ×2 IMPLANT
CLIP HMST 235XBRD CATH ROT (MISCELLANEOUS) IMPLANT
CLIP RESOLUTION 360 11X235 (MISCELLANEOUS)
ELECT REM PT RETURN 9FT ADLT (ELECTROSURGICAL)
ELECTRODE REM PT RTRN 9FT ADLT (ELECTROSURGICAL) IMPLANT
FCP ESCP3.2XJMB 240X2.8X (MISCELLANEOUS)
FORCEPS BIOP RAD 4 LRG CAP 4 (CUTTING FORCEPS) IMPLANT
FORCEPS BIOP RJ4 240 W/NDL (MISCELLANEOUS)
FORCEPS ESCP3.2XJMB 240X2.8X (MISCELLANEOUS) IMPLANT
GOWN CVR UNV OPN BCK APRN NK (MISCELLANEOUS) ×4 IMPLANT
GOWN ISOL THUMB LOOP REG UNIV (MISCELLANEOUS) ×4
INJECTOR VARIJECT VIN23 (MISCELLANEOUS) IMPLANT
KIT DEFENDO VALVE AND CONN (KITS) IMPLANT
KIT PRC NS LF DISP ENDO (KITS) ×2 IMPLANT
KIT PROCEDURE OLYMPUS (KITS) ×2
MANIFOLD NEPTUNE II (INSTRUMENTS) ×2 IMPLANT
MARKER SPOT ENDO TATTOO 5ML (MISCELLANEOUS) IMPLANT
PROBE APC STR FIRE (PROBE) IMPLANT
RETRIEVER NET PLAT FOOD (MISCELLANEOUS) IMPLANT
RETRIEVER NET ROTH 2.5X230 LF (MISCELLANEOUS) IMPLANT
SNARE COLD EXACTO (MISCELLANEOUS) IMPLANT
SNARE SHORT THROW 13M SML OVAL (MISCELLANEOUS) IMPLANT
SNARE SHORT THROW 30M LRG OVAL (MISCELLANEOUS) IMPLANT
SNARE SNG USE RND 15MM (INSTRUMENTS) IMPLANT
SYR INFLATION 60ML (SYRINGE) IMPLANT
TRAP ETRAP POLY (MISCELLANEOUS) IMPLANT
VARIJECT INJECTOR VIN23 (MISCELLANEOUS)
WATER STERILE IRR 250ML POUR (IV SOLUTION) ×2 IMPLANT
WIRE CRE 18-20MM 8CM F G (MISCELLANEOUS) IMPLANT

## 2022-10-22 NOTE — Anesthesia Preprocedure Evaluation (Addendum)
Anesthesia Evaluation  Patient identified by MRN, date of birth, ID band Patient awake    Reviewed: Allergy & Precautions, NPO status , Patient's Chart, lab work & pertinent test results  History of Anesthesia Complications Negative for: history of anesthetic complications  Airway Mallampati: III   Neck ROM: Full    Dental   Temporary crown:   Pulmonary neg pulmonary ROS   Pulmonary exam normal breath sounds clear to auscultation       Cardiovascular Exercise Tolerance: Good negative cardio ROS Normal cardiovascular exam Rhythm:Regular Rate:Normal  ECG 10/09/22: Sinus bradycardia (HR 51), no STEMI   Neuro/Psych negative neurological ROS     GI/Hepatic ,GERD  ,,  Endo/Other  negative endocrine ROS    Renal/GU negative Renal ROS     Musculoskeletal  (+) Arthritis ,    Abdominal   Peds  Hematology negative hematology ROS (+)   Anesthesia Other Findings   Reproductive/Obstetrics                             Anesthesia Physical Anesthesia Plan  ASA: 2  Anesthesia Plan: General   Post-op Pain Management:    Induction: Intravenous  PONV Risk Score and Plan: 2 and Propofol infusion, TIVA and Treatment may vary due to age or medical condition  Airway Management Planned: Natural Airway  Additional Equipment:   Intra-op Plan:   Post-operative Plan:   Informed Consent: I have reviewed the patients History and Physical, chart, labs and discussed the procedure including the risks, benefits and alternatives for the proposed anesthesia with the patient or authorized representative who has indicated his/her understanding and acceptance.       Plan Discussed with: CRNA  Anesthesia Plan Comments: (LMA/GETA backup discussed.  Patient consented for risks of anesthesia including but not limited to:  - adverse reactions to medications - damage to eyes, teeth, lips or other oral mucosa -  nerve damage due to positioning  - sore throat or hoarseness - damage to heart, brain, nerves, lungs, other parts of body or loss of life  Informed patient about role of CRNA in peri- and intra-operative care.  Patient voiced understanding.)        Anesthesia Quick Evaluation

## 2022-10-22 NOTE — Op Note (Signed)
[facility] Patient Name: Vincent Huffman Procedure Date: 10/22/2022 11:28 AM MRN: [id number] Account #: [id number] Date of Birth: Dec 12, 1975 Admit Type: Outpatient Age: 47 Room: [facility] OR ROOM 01 Gender: Male Note Status: Finalized Instrument Name: [id number] Procedure:             Upper GI endoscopy Indications:           Esophageal dysphagia Providers:             Lin Landsman MD, MD Referring MD:          Jonetta Osgood (Referring MD) Medicines:             General Anesthesia Complications:         No immediate complications. Estimated blood loss: None. Procedure:             Pre-Anesthesia Assessment:                        - Prior to the procedure, a History and Physical was                         performed, and patient medications and allergies were                         reviewed. The patient is competent. The risks and                         benefits of the procedure and the sedation options and                         risks were discussed with the patient. All questions                         were answered and informed consent was obtained.                         Patient identification and proposed procedure were                         verified by the physician, the nurse, the                         anesthesiologist, the anesthetist and the technician                         in the pre-procedure area in the procedure room in the                         endoscopy suite. Mental Status Examination: alert and                         oriented. Airway Examination: normal oropharyngeal                         airway and neck mobility. Respiratory Examination:                         clear to auscultation. CV Examination: normal.  Prophylactic Antibiotics: The patient does not require                         prophylactic antibiotics. Prior Anticoagulants: The                         patient has taken no  anticoagulant or antiplatelet                         agents. ASA Grade Assessment: II - A patient with mild                         systemic disease. After reviewing the risks and                         benefits, the patient was deemed in satisfactory                         condition to undergo the procedure. The anesthesia                         plan was to use general anesthesia. Immediately prior                         to administration of medications, the patient was                         re-assessed for adequacy to receive sedatives. The                         heart rate, respiratory rate, oxygen saturations,                         blood pressure, adequacy of pulmonary ventilation, and                         response to care were monitored throughout the                         procedure. The physical status of the patient was                         re-assessed after the procedure.                        After obtaining informed consent, the endoscope was                         passed under direct vision. Throughout the procedure,                         the patient's blood pressure, pulse, and oxygen                         saturations were monitored continuously. The Endoscope                         was introduced through the mouth, and advanced to the  second part of duodenum. The upper GI endoscopy was                         accomplished without difficulty. The patient tolerated                         the procedure well. Findings:      The duodenal bulb and second portion of the duodenum were normal.      A small hiatal hernia was present.      The entire examined stomach was normal.      The cardia and gastric fundus were normal on retroflexion.      Esophagogastric landmarks were identified: the gastroesophageal junction       was found at 39 cm from the incisors.      A widely patent Schatzki ring was found at the gastroesophageal  junction.      Mucosal changes including ringed esophagus, congestion (edema),       longitudinal markings, mucosal friability and vertical lines were found       in the entire esophagus. Esophageal findings were graded using the       Eosinophilic Esophagitis Endoscopic Reference Score (EoE-EREFS) as:       Edema Grade 1 Present (decreased clarity or absence of vascular       markings), Rings Grade 1 Mild (subtle circumferential ridges seen on       esophageal distension), Exudates Grade 1 Mild (scattered white lesions       involving less than 10 percent of the esophageal surface area), Furrows       Grade 1 Mild (vertical lines without visible depth) and Stricture none       (no stricture found). Biopsies were obtained from the proximal and       distal esophagus with cold forceps for histology of suspected       eosinophilic esophagitis. Impression:            - Normal duodenal bulb and second portion of the                         duodenum.                        - Small hiatal hernia.                        - Normal stomach.                        - Esophagogastric landmarks identified.                        - Widely patent Schatzki ring.                        - Esophageal mucosal changes suggestive of                         eosinophilic esophagitis.                        - Biopsies were taken with a cold forceps for  evaluation of eosinophilic esophagitis. Recommendation:        - Await pathology results.                        - Chopped diet.                        - Use Prilosec (omeprazole) 40 mg PO BID.                        - Return to my office in 2 months.                        - Proceed with colonoscopy as scheduled                        See colonoscopy report Procedure Code(s):     --- Professional ---                        [phone], Esophagogastroduodenoscopy, flexible,                         transoral; with biopsy, single or  multiple Diagnosis Code(s):     --- Professional ---                        K44.9, Diaphragmatic hernia without obstruction or                         gangrene                        K22.2, Esophageal obstruction                        R13.14, Dysphagia, pharyngoesophageal phase                        K22.89, Other specified disease of esophagus CPT copyright 2022 [employer] Association. All rights reserved. The codes documented in this report are preliminary and upon coder review may  be revised to meet current compliance requirements. Dr. Ulyess Mort Lin Landsman MD, MD 10/22/2022 11:59:13 AM This report has been signed electronically. Number of Addenda: 0 Note Initiated On: 10/22/2022 11:28 AM Total Procedure Duration: 0 hours 8 minutes 45 seconds  Estimated Blood Loss:  Estimated blood loss: none.      [facility]

## 2022-10-22 NOTE — H&P (Signed)
Cephas Darby, MD [address]  [place]  [place], [place] [address]  Main: [phone]  Fax: [phone] Pager: [phone]  Primary Care Physician:  Jonetta Osgood, NP Primary Gastroenterologist:  Dr. Cephas Darby  Pre-Procedure History & Physical: HPI:  Vincent Huffman is a 47 y.o. male is here for an EGD and a colonoscopy.   Past Medical History:  Diagnosis Date   Chronic reflux esophagitis    Complication of anesthesia    "spasms" during EGD   Degenerative joint disease (DJD) of lumbar spine    Esophagitis    GERD (gastroesophageal reflux disease)     Past Surgical History:  Procedure Laterality Date   ADENOIDECTOMY     ESOPHAGOGASTRODUODENOSCOPY N/A 02/28/2020   Procedure: ESOPHAGOGASTRODUODENOSCOPY (EGD);  Surgeon: Toledo, Benay Pike, MD;  Location: [facility] ENDOSCOPY;  Service: Gastroenterology;  Laterality: N/A;   TONSILLECTOMY      Prior to Admission medications   Medication Sig Start Date End Date Taking? Authorizing Provider  carisoprodol (SOMA) 350 MG tablet Take 1 tablet (350 mg total) by mouth daily as needed for muscle spasms. 01/15/22  Yes Abernathy, Yetta Flock, NP  cyclobenzaprine (FLEXERIL) 10 MG tablet Take 1 tablet (10 mg total) by mouth at bedtime. Take one tab po qhs for back spasm prn only 01/15/22  Yes Abernathy, Alyssa, NP  omeprazole (PRILOSEC OTC) 20 MG tablet Take 2 tablets (40 mg total) by mouth in the morning and at bedtime. 10/09/22 01/07/23 Yes Mumma, Larene Beach, MD  RABEprazole (ACIPHEX) 20 MG tablet Take 1 tablet (20 mg total) by mouth daily. 09/18/22  Yes Abernathy, Yetta Flock, NP  rizatriptan (MAXALT) 5 MG tablet Take 1 tablet (5 mg total) by mouth as needed (tension headache). May repeat in 2 hours if needed 09/18/22  Yes Abernathy, Alyssa, NP  baclofen (LIORESAL) 10 MG tablet Take 1 tablet (10 mg total) by mouth 3 (three) times daily. Patient not taking: Reported on 10/15/2022 01/15/22   Jonetta Osgood, NP  busPIRone (BUSPAR) 5 MG tablet Take 1  tablet (5 mg total) by mouth 3 (three) times daily. Patient not taking: Reported on 10/15/2022 04/26/22   Sharion Balloon, FNP  hydrOXYzine (VISTARIL) 25 MG capsule Take 1 capsule (25 mg total) by mouth every 8 (eight) hours as needed. Patient not taking: Reported on 10/15/2022 05/26/22   Brunetta Jeans, PA-C  tizanidine (ZANAFLEX) 2 MG capsule Take 1 capsule (2 mg total) by mouth 3 (three) times daily. Patient not taking: Reported on 10/15/2022 01/15/22   Jonetta Osgood, NP  ranitidine (ZANTAC) 150 MG capsule Take 1 capsule (150 mg total) by mouth 2 (two) times daily. 07/08/15 07/08/15  Carrie Mew, MD    Allergies as of 10/12/2022 - Review Complete 10/09/2022  Allergen Reaction Noted   Iodinated contrast media Other (See Comments) 11/17/2017    Family History  Problem Relation Age of Onset   Heart disease Mother    Heart disease Father    Diabetes Maternal Grandfather     Social History   Socioeconomic History   Marital status: Single    Spouse name: Not on file   Number of children: Not on file   Years of education: Not on file   Highest education level: Not on file  Occupational History   Occupation: Working Clinical biochemist   Tobacco Use   Smoking status: Never   Smokeless tobacco: Never  Vaping Use   Vaping Use: Never used  Substance and Sexual Activity   Alcohol use: No   Drug use:  No   Sexual activity: Not on file  Other Topics Concern   Not on file  Social History Narrative   Not on file   Social Determinants of Health   Financial Resource Strain: High Risk (01/11/2018)   Overall Financial Resource Strain (CARDIA)    Difficulty of Paying Living Expenses: Hard  Food Insecurity: Food Insecurity Present (01/11/2018)   Hunger Vital Sign    Worried About Running Out of Food in the Last Year: Sometimes true    Ran Out of Food in the Last Year: Sometimes true  Transportation Needs: No Transportation Needs (01/11/2018)   PRAPARE - Radiographer, therapeutic (Medical): No    Lack of Transportation (Non-Medical): No  Physical Activity: Sufficiently Active (01/11/2018)   Exercise Vital Sign    Days of Exercise per Week: 5 days    Minutes of Exercise per Session: 60 min  Stress: No Stress Concern Present (01/11/2018)   [place]    Feeling of Stress : Only a little  Social Connections: Moderately Isolated (01/11/2018)   Social Connection and Isolation Panel [NHANES]    Frequency of Communication with Friends and Family: Once a week    Frequency of Social Gatherings with Friends and Family: Once a week    Attends Religious Services: More than 4 times per year    Active Member of [employer] or Organizations: No    Attends Archivist Meetings: Never    Marital Status: Never married  Intimate Partner Violence: Unknown (01/11/2018)   Humiliation, Afraid, Rape, and Kick questionnaire    Fear of Current or Ex-Partner: Patient declined    Emotionally Abused: Patient declined    Physically Abused: Patient declined    Sexually Abused: Patient declined    Review of Systems: See HPI, otherwise negative ROS  Physical Exam: BP 121/78   Temp 98.6 F (37 C) (Tympanic)   Resp 12   Ht 6' 0.01" (1.829 m)   Wt 99.5 kg   SpO2 98%   BMI 29.74 kg/m  General:   Alert,  pleasant and cooperative in NAD Head:  Normocephalic and atraumatic. Neck:  Supple; no masses or thyromegaly. Lungs:  Clear throughout to auscultation.    Heart:  Regular rate and rhythm. Abdomen:  Soft, nontender and nondistended. Normal bowel sounds, without guarding, and without rebound.   Neurologic:  Alert and  oriented x4;  grossly normal neurologically.  Impression/Plan: Vincent Huffman is here for an EGD and a colonoscopy to be performed for dysphagia, colon cancer screening  Risks, benefits, limitations, and alternatives regarding  endoscopy and colonoscopy have been reviewed with the patient.   Questions have been answered.  All parties agreeable.   Sherri Sear, MD  10/22/2022, 10:42 AM

## 2022-10-22 NOTE — Telephone Encounter (Signed)
Per EGD report patient needs to follow up in 2 months. Called and left a message for call back.

## 2022-10-22 NOTE — Op Note (Signed)
[facility] Patient Name: Vincent Huffman Procedure Date: 10/22/2022 11:27 AM MRN: [id number] Account #: [id number] Date of Birth: 04-08-1976 Admit Type: Outpatient Age: 47 Room: [facility] OR ROOM 01 Gender: Male Note Status: Finalized Instrument Name: [id number] Procedure:             Colonoscopy Indications:           Screening for colorectal malignant neoplasm, This is                         the patient's first colonoscopy Providers:             Lin Landsman MD, MD Referring MD:          Jonetta Osgood (Referring MD) Medicines:             General Anesthesia Complications:         No immediate complications. Estimated blood loss: None. Procedure:             Pre-Anesthesia Assessment:                        - Prior to the procedure, a History and Physical was                         performed, and patient medications and allergies were                         reviewed. The patient is competent. The risks and                         benefits of the procedure and the sedation options and                         risks were discussed with the patient. All questions                         were answered and informed consent was obtained.                         Patient identification and proposed procedure were                         verified by the physician, the nurse, the                         anesthesiologist, the anesthetist and the technician                         in the pre-procedure area in the procedure room in the                         endoscopy suite. Mental Status Examination: alert and                         oriented. Airway Examination: normal oropharyngeal                         airway and neck mobility. Respiratory Examination:  clear to auscultation. CV Examination: normal.                         Prophylactic Antibiotics: The patient does not require                         prophylactic antibiotics.  Prior Anticoagulants: The                         patient has taken no anticoagulant or antiplatelet                         agents. ASA Grade Assessment: II - A patient with mild                         systemic disease. After reviewing the risks and                         benefits, the patient was deemed in satisfactory                         condition to undergo the procedure. The anesthesia                         plan was to use general anesthesia. Immediately prior                         to administration of medications, the patient was                         re-assessed for adequacy to receive sedatives. The                         heart rate, respiratory rate, oxygen saturations,                         blood pressure, adequacy of pulmonary ventilation, and                         response to care were monitored throughout the                         procedure. The physical status of the patient was                         re-assessed after the procedure.                        After obtaining informed consent, the colonoscope was                         passed under direct vision. Throughout the procedure,                         the patient's blood pressure, pulse, and oxygen                         saturations were monitored continuously. The  Colonoscope was introduced through the anus and                         advanced to the the terminal ileum, with                         identification of the appendiceal orifice and IC                         valve. The colonoscopy was performed without                         difficulty. The patient tolerated the procedure well.                         The quality of the bowel preparation was evaluated                         using the BBPS [facility] Bowel Preparation Scale) with                         scores of: Right Colon = 3, Transverse Colon = 3 and                         Left Colon = 3 (entire mucosa seen  well with no                         residual staining, small fragments of stool or opaque                         liquid). The total BBPS score equals 9. The terminal                         ileum, ileocecal valve, appendiceal orifice, and                         rectum were photographed. Findings:      The perianal and digital rectal examinations were normal. Pertinent       negatives include normal sphincter tone and no palpable rectal lesions.      The terminal ileum appeared normal.      The entire examined colon appeared normal.      Non-bleeding external hemorrhoids were found during retroflexion. The       hemorrhoids were medium-sized. Impression:            - The examined portion of the ileum was normal.                        - The entire examined colon is normal.                        - Non-bleeding external hemorrhoids.                        - No specimens collected. Recommendation:        - Discharge patient to home (with escort).                        -  Resume previous diet today.                        - Continue present medications.                        - Repeat colonoscopy in 10 years for screening                         purposes. Procedure Code(s):     --- Professional ---                        [id number], Colorectal cancer screening; colonoscopy on                         individual not meeting criteria for high risk Diagnosis Code(s):     --- Professional ---                        Z12.11, Encounter for screening for malignant neoplasm                         of colon                        K64.4, Residual hemorrhoidal skin tags CPT copyright 2022 American Medical Association. All rights reserved. The codes documented in this report are preliminary and upon coder review may  be revised to meet current compliance requirements. Dr. Ulyess Mort Lin Landsman MD, MD 10/22/2022 12:13:38 PM This report has been signed electronically. Number of Addenda: 0 Note  Initiated On: 10/22/2022 11:27 AM Scope Withdrawal Time: 0 hours 8 minutes 17 seconds  Total Procedure Duration: 0 hours 10 minutes 53 seconds  Estimated Blood Loss:  Estimated blood loss: none.      [facility]

## 2022-10-22 NOTE — Telephone Encounter (Signed)
Patient called back and got patient schedule for 12/30/2022 at 1:15pm

## 2022-10-22 NOTE — Anesthesia Postprocedure Evaluation (Signed)
Anesthesia Post Note  Patient: Vincent Huffman  Procedure(s) Performed: COLONOSCOPY WITH PROPOFOL (Rectum) ESOPHAGOGASTRODUODENOSCOPY (EGD) WITH PROPOFOL (Mouth)  Patient location during evaluation: PACU Anesthesia Type: General Level of consciousness: awake and alert, oriented and patient cooperative Pain management: pain level controlled Vital Signs Assessment: post-procedure vital signs reviewed and stable Respiratory status: spontaneous breathing, nonlabored ventilation and respiratory function stable Cardiovascular status: blood pressure returned to baseline and stable Postop Assessment: adequate PO intake Anesthetic complications: no   No notable events documented.   Last Vitals:  Vitals:   10/22/22 1230 10/22/22 1245  BP: 122/76 121/74  Pulse: 68 (!) 54  Resp: (!) 27 (!) 21  Temp:  (!) 36.2 C  SpO2: 98% 99%    Last Pain:  Vitals:   10/22/22 1230  TempSrc:   PainSc: [place]

## 2022-10-22 NOTE — Transfer of Care (Signed)
Immediate Anesthesia Transfer of Care Note  Patient: Vincent Huffman  Procedure(s) Performed: COLONOSCOPY WITH PROPOFOL (Rectum) ESOPHAGOGASTRODUODENOSCOPY (EGD) WITH PROPOFOL (Mouth)  Patient Location: PACU  Anesthesia Type: General  Level of Consciousness: awake, alert  and patient cooperative  Airway and Oxygen Therapy: Patient Spontanous Breathing and Patient connected to supplemental oxygen  Post-op Assessment: Post-op Vital signs reviewed, Patient's Cardiovascular Status Stable, Respiratory Function Stable, Patent Airway and No signs of Nausea or vomiting  Post-op Vital Signs: Reviewed and stable  Complications: No notable events documented.

## 2022-10-23 ENCOUNTER — Encounter: Payer: Self-pay | Admitting: Gastroenterology

## 2022-10-26 ENCOUNTER — Telehealth: Payer: Self-pay

## 2022-10-26 ENCOUNTER — Encounter: Payer: Self-pay | Admitting: Gastroenterology

## 2022-10-26 LAB — SURGICAL PATHOLOGY

## 2022-10-26 MED ORDER — EOHILIA 2 MG/10ML PO SUSP: 10.0000 mL | Freq: Two times a day (BID) | ORAL | 2 refills | Status: AC

## 2022-10-26 MED ORDER — OMEPRAZOLE 40 MG PO CPDR: 40.0000 mg | DELAYED_RELEASE_CAPSULE | Freq: Two times a day (BID) | ORAL | 2 refills | Status: AC

## 2022-10-26 NOTE — Telephone Encounter (Signed)
Submitted PA through cover my meds for [facility] waiting on response from insurance company.

## 2022-10-26 NOTE — Telephone Encounter (Signed)
-----   Message from Lin Landsman, MD sent at 10/26/2022  3:01 PM EDT ----- Got his message via MyChart and reviewed his pathology results.  He most likely has eosinophilic esophagitis which explains his symptoms of difficulty swallowing and typical chest pain.  Recommend switching from Aciphex to Prilosec 40 mg twice daily before meals for 1 month and 2 refills.  In the meantime, let's try prescription for Eohilia 2 mg twice daily for 12 weeks  Rohini Vanga

## 2022-10-26 NOTE — Telephone Encounter (Signed)
Patient called back and verbalized understanding of results  

## 2022-10-26 NOTE — Telephone Encounter (Signed)
Called and left a message for call back. Please review the Eohilia 2mg  but does not know what the dosage is please advise

## 2022-10-26 NOTE — Telephone Encounter (Signed)
Sent medication to the pharmacy  

## 2022-10-27 NOTE — Telephone Encounter (Signed)
Medication approved 10/26/2022 to 10/26/2023

## 2022-10-28 NOTE — Telephone Encounter (Signed)
Dr Marius Ditch wanted me to call the pharmacy to find out how much the medication was on patient insurance. The price is 45 dollars

## 2022-11-10 ENCOUNTER — Ambulatory Visit (INDEPENDENT_AMBULATORY_CARE_PROVIDER_SITE_OTHER): Payer: BC Managed Care – PPO | Admitting: Nurse Practitioner

## 2022-11-10 ENCOUNTER — Encounter: Payer: Self-pay | Admitting: Nurse Practitioner

## 2022-11-10 VITALS — BP 127/69 | HR 62 | Temp 98.3°F | Resp 16 | Ht 72.0 in | Wt 227.8 lb

## 2022-11-10 DIAGNOSIS — K200 Eosinophilic esophagitis: Secondary | ICD-10-CM | POA: Diagnosis not present

## 2022-11-10 DIAGNOSIS — K219 Gastro-esophageal reflux disease without esophagitis: Secondary | ICD-10-CM

## 2022-11-10 DIAGNOSIS — K2289 Other specified disease of esophagus: Secondary | ICD-10-CM

## 2022-11-10 MED ORDER — HYOSCYAMINE SULFATE 0.125 MG PO TBDP: 0.1250 mg | ORAL_TABLET | Freq: Four times a day (QID) | ORAL | 2 refills | Status: DC | PRN

## 2022-11-10 MED ORDER — HYDROCODONE-ACETAMINOPHEN 5-325 MG PO TABS: 1.0000 | ORAL_TABLET | ORAL | 0 refills | Status: AC | PRN

## 2022-11-10 NOTE — Progress Notes (Signed)
[facility] [address] [place], [place] [address]  Internal MEDICINE  Office Visit Note  Patient Name: Vincent Huffman  [id number]  [id number]  Date of Service: 11/10/2022  Chief Complaint  Patient presents with   Gastroesophageal Reflux     HPI Maika presents for an acute sick visit for GERD due to eosinophilic esophagitis.  Still having pain from it. Heartburn burning pain, sharp pain sometimes.     Current Medication:  Outpatient Encounter Medications as of 11/10/2022  Medication Sig   Budesonide (EOHILIA) 2 MG/10ML SUSP Take 10 mLs by mouth 2 (two) times daily.   carisoprodol (SOMA) 350 MG tablet Take 1 tablet (350 mg total) by mouth daily as needed for muscle spasms.   cyclobenzaprine (FLEXERIL) 10 MG tablet Take 1 tablet (10 mg total) by mouth at bedtime. Take one tab po qhs for back spasm prn only   HYDROcodone-acetaminophen (NORCO/VICODIN) 5-325 MG tablet Take 1 tablet by mouth every 4 (four) hours as needed for up to 5 days for moderate pain.   hyoscyamine (ANASPAZ) 0.125 MG TBDP disintergrating tablet Place 1 tablet (0.125 mg total) under the tongue every 6 (six) hours as needed for cramping.   omeprazole (PRILOSEC) 40 MG capsule Take 1 capsule (40 mg total) by mouth 2 (two) times daily before a meal.   rizatriptan (MAXALT) 5 MG tablet Take 1 tablet (5 mg total) by mouth as needed (tension headache). May repeat in 2 hours if needed   [DISCONTINUED] ranitidine (ZANTAC) 150 MG capsule Take 1 capsule (150 mg total) by mouth 2 (two) times daily.   No facility-administered encounter medications on file as of 11/10/2022.      Medical History: Past Medical History:  Diagnosis Date   Chronic reflux esophagitis    Complication of anesthesia    "spasms" during EGD   Degenerative joint disease (DJD) of lumbar spine    Esophagitis    GERD (gastroesophageal reflux disease)      Vital Signs: BP 127/69   Pulse 62   Temp 98.3 F (36.8 C)   Resp 16   Ht 6'  (1.829 m)   Wt 227 lb 12.8 oz (103.3 kg)   SpO2 97%   BMI 30.90 kg/m    Review of Systems  Constitutional:  Negative for fatigue.  HENT: Negative.    Respiratory:  Negative for cough, chest tightness, shortness of breath and wheezing.   Cardiovascular:  Negative for chest pain (related to heartburn) and palpitations.  Gastrointestinal:        Severe heartburn, and sharp pain in esophagus and stomach.   Neurological: Negative.     Physical Exam Vitals reviewed.  Constitutional:      Appearance: Normal appearance. He is not ill-appearing.  HENT:     Head: Normocephalic and atraumatic.  Eyes:     Pupils: Pupils are equal, round, and reactive to light.  Cardiovascular:     Rate and Rhythm: Normal rate and regular rhythm.  Pulmonary:     Effort: Pulmonary effort is normal. No respiratory distress.  Neurological:     Mental Status: He is alert and oriented to person, place, and time.  Psychiatric:        Mood and Affect: Mood normal.        Behavior: Behavior normal.       Assessment/Plan: 1. Esophageal pain Hyoscyamine and hydrocodone prescribed.  - hyoscyamine (ANASPAZ) 0.125 MG TBDP disintergrating tablet; Place 1 tablet (0.125 mg total) under the tongue every 6 (six) hours  as needed for cramping.  Dispense: 30 tablet; Refill: 2 - HYDROcodone-acetaminophen (NORCO/VICODIN) 5-325 MG tablet; Take 1 tablet by mouth every 4 (four) hours as needed for up to 5 days for moderate pain.  Dispense: 30 tablet; Refill: 0  2. Gastroesophageal reflux disease without esophagitis Prescriptions sent in to pharmacy.  - hyoscyamine (ANASPAZ) 0.125 MG TBDP disintergrating tablet; Place 1 tablet (0.125 mg total) under the tongue every 6 (six) hours as needed for cramping.  Dispense: 30 tablet; Refill: 2 - HYDROcodone-acetaminophen (NORCO/VICODIN) 5-325 MG tablet; Take 1 tablet by mouth every 4 (four) hours as needed for up to 5 days for moderate pain.  Dispense: 30 tablet; Refill: 0  3.  Eosinophilic esophagitis Hyoscyamine prescribed to help with esophageal spasms. Hydrocodone prescribed for more severe pain.  - hyoscyamine (ANASPAZ) 0.125 MG TBDP disintergrating tablet; Place 1 tablet (0.125 mg total) under the tongue every 6 (six) hours as needed for cramping.  Dispense: 30 tablet; Refill: 2 - HYDROcodone-acetaminophen (NORCO/VICODIN) 5-325 MG tablet; Take 1 tablet by mouth every 4 (four) hours as needed for up to 5 days for moderate pain.  Dispense: 30 tablet; Refill: 0   General Counseling: Josel verbalizes understanding of the findings of todays visit and agrees with plan of treatment. I have discussed any further diagnostic evaluation that may be needed or ordered today. We also reviewed his medications today. he has been encouraged to call the office with any questions or concerns that should arise related to todays visit.    Counseling:    No orders of the defined types were placed in this encounter.   Meds ordered this encounter  Medications   hyoscyamine (ANASPAZ) 0.125 MG TBDP disintergrating tablet    Sig: Place 1 tablet (0.125 mg total) under the tongue every 6 (six) hours as needed for cramping.    Dispense:  30 tablet    Refill:  2   HYDROcodone-acetaminophen (NORCO/VICODIN) 5-325 MG tablet    Sig: Take 1 tablet by mouth every 4 (four) hours as needed for up to 5 days for moderate pain.    Dispense:  30 tablet    Refill:  0    Please fill now    Return if symptoms worsen or fail to improve.  [place] Controlled Substance Database was reviewed by me for overdose risk score (ORS)  Time spent:30 Minutes Time spent with patient included reviewing progress notes, labs, imaging studies, and discussing plan for follow up.   This patient was seen by Sallyanne Kuster, FNP-C in collaboration with Dr. Beverely Risen as a part of collaborative care agreement.  Kinberly Perris R. Tedd Sias, MSN, FNP-C Internal Medicine

## 2022-12-30 ENCOUNTER — Encounter: Payer: Self-pay | Admitting: Gastroenterology

## 2022-12-30 ENCOUNTER — Ambulatory Visit: Payer: BC Managed Care – PPO | Admitting: Gastroenterology

## 2022-12-30 ENCOUNTER — Telehealth: Payer: Self-pay

## 2022-12-30 ENCOUNTER — Other Ambulatory Visit: Payer: Self-pay

## 2022-12-30 VITALS — BP 133/79 | HR 67 | Temp 97.8°F | Ht 72.0 in | Wt 227.2 lb

## 2022-12-30 DIAGNOSIS — K200 Eosinophilic esophagitis: Secondary | ICD-10-CM

## 2022-12-30 NOTE — Progress Notes (Signed)
Arlyss Repress, MD [address]  [address]  [place], [place] [address]  Main: [phone]  Fax: [phone]    Gastroenterology Consultation  Referring Provider:     Sallyanne Kuster, NP Primary Care Physician:  Sallyanne Kuster, NP Primary Gastroenterologist:  Dr. Arlyss Repress Reason for Consultation: Eosinophilic esophagitis        HPI:   Vincent Huffman is a 47 y.o. male referred by Sallyanne Kuster, NP  for consultation & management of eosinophilic esophagitis.  Patient has longstanding history of intermittent symptoms of difficulty swallowing, also with history of food impaction who underwent upper endoscopy on 10/22/2022 which confirmed eosinophilic esophagitis, also with small hiatal hernia.  He is being treated with omeprazole 40 mg p.o. twice daily before meals as well as budesonide 2 mg p.o. twice daily.  He reports significant improvement in his symptoms.  He also notices recurrence of closing his throat, esophageal spasm if he misses the dose on budesonide.  He is trying to have early and light dinner He is also interested in dietary management of eosinophilic esophagitis  NSAIDs: None  Antiplts/Anticoagulants/Anti thrombotics: None  GI Procedures:  EGD 10/22/2022 DIAGNOSIS: A. ESOPHAGUS, DISTAL; COLD BIOPSY: - BENIGN SQUAMOUS MUCOSA WITH ACANTHOSIS, SPONGIOSIS, AND SIGNIFICANTLY INCREASED INTRAEPITHELIAL EOSINOPHILS (UP TO 50 PER HPF). - NEGATIVE FOR DYSPLASIA AND MALIGNANCY.  B. ESOPHAGUS, PROXIMAL; COLD BIOPSY: - BENIGN SQUAMOUS MUCOSA WITH ACANTHOSIS, SPONGIOSIS, AND FOCALLY INCREASED EOSINOPHILS (UP TO 10 PER HPF). - NEGATIVE FOR DYSPLASIA AND MALIGNANCY.  Comment: Eosinophilic esophagitis (EoE) is a chronic, immune/antigen mediated esophageal disease characterized clinically by symptoms related to esophageal dysfunction and histologically by eosinophil predominant inflammation, defined as greater than or equal to 15 eos/hpf (approx 60 eos/mm2)  in the majority of cases.   Past Medical History:  Diagnosis Date   Chronic reflux esophagitis    Complication of anesthesia    "spasms" during EGD   Degenerative joint disease (DJD) of lumbar spine    Esophagitis    GERD (gastroesophageal reflux disease)     Past Surgical History:  Procedure Laterality Date   ADENOIDECTOMY     COLONOSCOPY WITH PROPOFOL N/A 10/22/2022   Procedure: COLONOSCOPY WITH PROPOFOL;  Surgeon: Toney Reil, MD;  Location: [facility];  Service: Endoscopy;  Laterality: N/A;   ESOPHAGOGASTRODUODENOSCOPY N/A 02/28/2020   Procedure: ESOPHAGOGASTRODUODENOSCOPY (EGD);  Surgeon: Toledo, Boykin Nearing, MD;  Location: [facility];  Service: Gastroenterology;  Laterality: N/A;   ESOPHAGOGASTRODUODENOSCOPY (EGD) WITH PROPOFOL N/A 10/22/2022   Procedure: ESOPHAGOGASTRODUODENOSCOPY (EGD) WITH PROPOFOL;  Surgeon: Toney Reil, MD;  Location: [facility];  Service: Endoscopy;  Laterality: N/A;   TONSILLECTOMY       Current Outpatient Medications:    carisoprodol (SOMA) 350 MG tablet, Take 1 tablet (350 mg total) by mouth daily as needed for muscle spasms., Disp: 30 tablet, Rfl: 0   cyclobenzaprine (FLEXERIL) 10 MG tablet, Take 1 tablet (10 mg total) by mouth at bedtime. Take one tab po qhs for back spasm prn only, Disp: 30 tablet, Rfl: 1   hyoscyamine (ANASPAZ) 0.125 MG TBDP disintergrating tablet, Place 1 tablet (0.125 mg total) under the tongue every 6 (six) hours as needed for cramping., Disp: 30 tablet, Rfl: 2   omeprazole (PRILOSEC) 40 MG capsule, Take 1 capsule (40 mg total) by mouth 2 (two) times daily before a meal., Disp: 60 capsule, Rfl: 2   rizatriptan (MAXALT) 5 MG tablet, Take 1 tablet (5 mg total) by mouth as needed (tension headache). May  repeat in 2 hours if needed, Disp: 10 tablet, Rfl: 3   Family History  Problem Relation Age of Onset   Heart disease Mother    Heart disease Father    Diabetes Maternal Grandfather       Social History   Tobacco Use   Smoking status: Never   Smokeless tobacco: Never  Vaping Use   Vaping Use: Never used  Substance Use Topics   Alcohol use: No   Drug use: No    Allergies as of 12/30/2022 - Review Complete 12/30/2022  Allergen Reaction Noted   Iodinated contrast media Other (See Comments) 11/17/2017    Review of Systems:    All systems reviewed and negative except where noted in HPI.   Physical Exam:  BP 133/79 (BP Location: Left Arm, Patient Position: Sitting, Cuff Size: Normal)   Pulse 67   Temp 97.8 F (36.6 C) (Oral)   Ht 6' (1.829 m)   Wt 227 lb 4 oz (103.1 kg)   BMI 30.82 kg/m  No LMP for male patient.  General:   Alert,  Well-developed, well-nourished, pleasant and cooperative in NAD Head:  Normocephalic and atraumatic. Eyes:  Sclera clear, no icterus.   Conjunctiva pink. Ears:  Normal auditory acuity. Nose:  No deformity, discharge, or lesions. Mouth:  No deformity or lesions,oropharynx pink & moist. Neck:  Supple; no masses or thyromegaly. Lungs:  Respirations even and unlabored.  Clear throughout to auscultation.   No wheezes, crackles, or rhonchi. No acute distress. Heart:  Regular rate and rhythm; no murmurs, clicks, rubs, or gallops. Abdomen:  Normal bowel sounds. Soft, non-tender and non-distended without masses, hepatosplenomegaly or hernias noted.  No guarding or rebound tenderness.   Rectal: Not performed Msk:  Symmetrical without gross deformities. Good, equal movement & strength bilaterally. Pulses:  Normal pulses noted. Extremities:  No clubbing or edema.  No cyanosis. Neurologic:  Alert and oriented x3;  grossly normal neurologically. Skin:  Intact without significant lesions or rashes. No jaundice. Psych:  Alert and cooperative. Normal mood and affect.  Imaging Studies: None  Assessment and Plan:   Vincent Huffman is a 47 y.o. pleasant Caucasian male with eosinophilic esophagitis, biopsy-proven based on EGD in  10/22/2022  Eosinophilic esophagitis Continue omeprazole 40 mg p.o. twice daily before meals Continue budesonide 2 mg p.o. twice daily for 12 weeks followed by once daily Discussed about 2/4 food elimination diet, information provided via MyChart Discussed about repeat EGD 3 months after treatment to assess response to PPI and budesonide Also, discussed about the role of Dupixent if he has persistent eosinophilic esophagitis or if it recurs   Follow up in 3 months   Arlyss Repress, MD

## 2022-12-30 NOTE — Telephone Encounter (Signed)
Patient did not stop at check out to make return appointment with Dr. Allegra Lai. Can you please call patient to schedule appointment with Dr. Allegra Lai 2 weeks after schedule EGD which is 03/05/2023. Which would be the week of 03/22/2023

## 2023-01-18 ENCOUNTER — Encounter: Payer: Self-pay | Admitting: Nurse Practitioner

## 2023-01-18 ENCOUNTER — Ambulatory Visit (INDEPENDENT_AMBULATORY_CARE_PROVIDER_SITE_OTHER): Payer: BC Managed Care – PPO | Admitting: Nurse Practitioner

## 2023-01-18 VITALS — BP 120/84 | HR 100 | Temp 98.4°F | Resp 16 | Ht 72.0 in | Wt 224.6 lb

## 2023-01-18 DIAGNOSIS — Z0001 Encounter for general adult medical examination with abnormal findings: Secondary | ICD-10-CM

## 2023-01-18 DIAGNOSIS — E782 Mixed hyperlipidemia: Secondary | ICD-10-CM | POA: Diagnosis not present

## 2023-01-18 DIAGNOSIS — K200 Eosinophilic esophagitis: Secondary | ICD-10-CM

## 2023-01-18 NOTE — Progress Notes (Signed)
[facility] [address] [place], [place] [address]  Internal MEDICINE  Office Visit Note  Patient Name: Vincent Huffman  [id number]  [id number]  Date of Service: 01/18/2023  Chief Complaint  Patient presents with   Gastroesophageal Reflux   Annual Exam    HPI Vincent Huffman presents for an annual well visit and physical exam.  Well-appearing 47 y.o. male with chronic low back pain and eosinophilic esophagitis.  Routine CRC screening: colonoscopy was done in march this year  Labs: recent labs done this year, will repeat labs in 6 months  New or worsening pain: none Other concerns: none     Current Medication: Outpatient Encounter Medications as of 01/18/2023  Medication Sig   carisoprodol (SOMA) 350 MG tablet Take 1 tablet (350 mg total) by mouth daily as needed for muscle spasms.   cyclobenzaprine (FLEXERIL) 10 MG tablet Take 1 tablet (10 mg total) by mouth at bedtime. Take one tab po qhs for back spasm prn only   hyoscyamine (ANASPAZ) 0.125 MG TBDP disintergrating tablet Place 1 tablet (0.125 mg total) under the tongue every 6 (six) hours as needed for cramping.   omeprazole (PRILOSEC) 40 MG capsule Take 1 capsule (40 mg total) by mouth 2 (two) times daily before a meal.   rizatriptan (MAXALT) 5 MG tablet Take 1 tablet (5 mg total) by mouth as needed (tension headache). May repeat in 2 hours if needed   [DISCONTINUED] ranitidine (ZANTAC) 150 MG capsule Take 1 capsule (150 mg total) by mouth 2 (two) times daily.   No facility-administered encounter medications on file as of 01/18/2023.    Surgical History: Past Surgical History:  Procedure Laterality Date   ADENOIDECTOMY     COLONOSCOPY WITH PROPOFOL N/A 10/22/2022   Procedure: COLONOSCOPY WITH PROPOFOL;  Surgeon: Toney Reil, MD;  Location: [facility];  Service: Endoscopy;  Laterality: N/A;   ESOPHAGOGASTRODUODENOSCOPY N/A 02/28/2020   Procedure: ESOPHAGOGASTRODUODENOSCOPY (EGD);  Surgeon: Toledo,  Boykin Nearing, MD;  Location: [facility];  Service: Gastroenterology;  Laterality: N/A;   ESOPHAGOGASTRODUODENOSCOPY (EGD) WITH PROPOFOL N/A 10/22/2022   Procedure: ESOPHAGOGASTRODUODENOSCOPY (EGD) WITH PROPOFOL;  Surgeon: Toney Reil, MD;  Location: [facility];  Service: Endoscopy;  Laterality: N/A;   TONSILLECTOMY      Medical History: Past Medical History:  Diagnosis Date   Chronic reflux esophagitis    Complication of anesthesia    "spasms" during EGD   Degenerative joint disease (DJD) of lumbar spine    Esophagitis    GERD (gastroesophageal reflux disease)     Family History: Family History  Problem Relation Age of Onset   Heart disease Mother    Heart disease Father    Diabetes Maternal Grandfather     Social History   Socioeconomic History   Marital status: Single    Spouse name: Not on file   Number of children: Not on file   Years of education: Not on file   Highest education level: Not on file  Occupational History   Occupation: Working Personnel officer   Tobacco Use   Smoking status: Never   Smokeless tobacco: Never  Vaping Use   Vaping Use: Never used  Substance and Sexual Activity   Alcohol use: No   Drug use: No   Sexual activity: Not on file  Other Topics Concern   Not on file  Social History Narrative   Not on file   Social Determinants of Health   Financial Resource Strain: High Risk (01/11/2018)   Overall Financial  Resource Strain (CARDIA)    Difficulty of Paying Living Expenses: Hard  Food Insecurity: Food Insecurity Present (01/11/2018)   Hunger Vital Sign    Worried About Running Out of Food in the Last Year: Sometimes true    Ran Out of Food in the Last Year: Sometimes true  Transportation Needs: No Transportation Needs (01/11/2018)   PRAPARE - Administrator, Civil Service (Medical): No    Lack of Transportation (Non-Medical): No  Physical Activity: Sufficiently Active (01/11/2018)   Exercise Vital Sign    Days  of Exercise per Week: 5 days    Minutes of Exercise per Session: 60 min  Stress: No Stress Concern Present (01/11/2018)   [employer] of Occupational Health - Occupational Stress Questionnaire    Feeling of Stress : Only a little  Social Connections: Moderately Isolated (01/11/2018)   Social Connection and Isolation Panel [NHANES]    Frequency of Communication with Friends and Family: Once a week    Frequency of Social Gatherings with Friends and Family: Once a week    Attends Religious Services: More than 4 times per year    Active Member of [employer] or Organizations: No    Attends Banker Meetings: Never    Marital Status: Never married  Intimate Partner Violence: Unknown (01/11/2018)   Humiliation, Afraid, Rape, and Kick questionnaire    Fear of Current or Ex-Partner: Patient declined    Emotionally Abused: Patient declined    Physically Abused: Patient declined    Sexually Abused: Patient declined      Review of Systems  Constitutional:  Negative for activity change, appetite change, chills, fatigue, fever and unexpected weight change.  HENT: Negative.  Negative for congestion, ear pain, rhinorrhea, sore throat and trouble swallowing.   Eyes: Negative.   Respiratory: Negative.  Negative for cough, chest tightness, shortness of breath and wheezing.   Cardiovascular: Negative.  Negative for chest pain and palpitations.  Gastrointestinal: Negative.  Negative for abdominal pain, blood in stool, constipation, diarrhea, nausea and vomiting.  Endocrine: Negative.   Genitourinary: Negative.  Negative for difficulty urinating, dysuria, frequency, hematuria and urgency.  Musculoskeletal: Negative.  Negative for arthralgias, back pain, joint swelling, myalgias and neck pain.  Skin: Negative.  Negative for rash and wound.  Allergic/Immunologic: Negative.  Negative for immunocompromised state.  Neurological: Negative.  Negative for dizziness, seizures, numbness and headaches.   Hematological: Negative.   Psychiatric/Behavioral: Negative.  Negative for behavioral problems, self-injury and suicidal ideas. The patient is not nervous/anxious.     Vital Signs: BP 120/84   Pulse 100   Temp 98.4 F (36.9 C)   Resp 16   Ht 6' (1.829 m)   Wt 224 lb 9.6 oz (101.9 kg)   SpO2 99%   BMI 30.46 kg/m    Physical Exam Vitals reviewed.  Constitutional:      General: He is not in acute distress.    Appearance: He is not ill-appearing.  HENT:     Head: Normocephalic and atraumatic.     Right Ear: Tympanic membrane, ear canal and external ear normal.     Left Ear: Tympanic membrane, ear canal and external ear normal.     Nose: Nose normal. No congestion.     Mouth/Throat:     Mouth: Mucous membranes are moist.     Pharynx: Oropharynx is clear. No posterior oropharyngeal erythema.  Eyes:     Extraocular Movements: Extraocular movements intact.     Conjunctiva/sclera: Conjunctivae normal.  Pupils: Pupils are equal, round, and reactive to light.  Cardiovascular:     Rate and Rhythm: Normal rate and regular rhythm.     Pulses: Normal pulses.     Heart sounds: Normal heart sounds. No murmur heard. Pulmonary:     Effort: Pulmonary effort is normal. No respiratory distress.     Breath sounds: Normal breath sounds.  Abdominal:     General: Bowel sounds are normal. There is no distension.     Palpations: Abdomen is soft. There is no mass.     Tenderness: There is no abdominal tenderness. There is no guarding or rebound.     Hernia: No hernia is present.  Musculoskeletal:        General: Normal range of motion.     Cervical back: Normal range of motion and neck supple.  Lymphadenopathy:     Cervical: No cervical adenopathy.  Skin:    General: Skin is warm and dry.     Capillary Refill: Capillary refill takes less than 2 seconds.  Neurological:     Mental Status: He is alert and oriented to person, place, and time.  Psychiatric:        Mood and Affect: Mood  normal.        Behavior: Behavior normal.        Thought Content: Thought content normal.        Judgment: Judgment normal.        Assessment/Plan: 1. Encounter for routine adult health examination with abnormal findings Age-appropriate preventive screenings and vaccinations discussed, annual physical exam completed. Routine labs for health maintenance deferred for 6 months. PHM updated.   2. Eosinophilic esophagitis Treated by GI specialist, stable at this time.   3. Mixed hyperlipidemia Has been working on diet and lifestyle modifications. Will repeat lab in 6 months      General Counseling: Trinten verbalizes understanding of the findings of todays visit and agrees with plan of treatment. I have discussed any further diagnostic evaluation that may be needed or ordered today. We also reviewed his medications today. he has been encouraged to call the office with any questions or concerns that should arise related to todays visit.    No orders of the defined types were placed in this encounter.   No orders of the defined types were placed in this encounter.   Return in about 6 months (around 07/20/2023) for F/U, [place] Devine PCP, Labs.   Total time spent:30 Minutes Time spent includes review of chart, medications, test results, and follow up plan with the patient.   [place] Controlled Substance Database was reviewed by me.  This patient was seen by Sallyanne Kuster, FNP-C in collaboration with Dr. Beverely Risen as a part of collaborative care agreement.  Lively Haberman R. Tedd Sias, MSN, FNP-C Internal medicine

## 2023-01-19 ENCOUNTER — Encounter: Payer: Self-pay | Admitting: Nurse Practitioner

## 2023-03-01 ENCOUNTER — Telehealth: Payer: Self-pay

## 2023-03-01 NOTE — Telephone Encounter (Signed)
Patient called reschedule EGD for 03/05/2023 with Endo because procedure is in depo. Called patient and left a message for call back to see if we could reschedule the procedure.

## 2023-03-05 ENCOUNTER — Encounter: Admission: RE | Payer: Self-pay | Source: Home / Self Care

## 2023-03-05 ENCOUNTER — Ambulatory Visit
Admission: RE | Admit: 2023-03-05 | Payer: BC Managed Care – PPO | Source: Home / Self Care | Admitting: Gastroenterology

## 2023-03-05 SURGERY — ESOPHAGOGASTRODUODENOSCOPY (EGD) WITH PROPOFOL
Anesthesia: General

## 2023-03-09 ENCOUNTER — Telehealth: Payer: BC Managed Care – PPO | Admitting: Physician Assistant

## 2023-03-09 DIAGNOSIS — M5441 Lumbago with sciatica, right side: Secondary | ICD-10-CM | POA: Diagnosis not present

## 2023-03-09 DIAGNOSIS — M5442 Lumbago with sciatica, left side: Secondary | ICD-10-CM | POA: Diagnosis not present

## 2023-03-09 MED ORDER — TIZANIDINE HCL 2 MG PO TABS: 2.0000 mg | ORAL_TABLET | Freq: Four times a day (QID) | ORAL | 0 refills | Status: DC | PRN

## 2023-03-09 MED ORDER — NAPROXEN 500 MG PO TABS: 500.0000 mg | ORAL_TABLET | Freq: Two times a day (BID) | ORAL | 0 refills | Status: DC

## 2023-03-09 NOTE — Progress Notes (Signed)
I have spent 5 minutes in review of e-visit questionnaire, review and updating patient chart, medical decision making and response to patient.   Khylen Riolo Cody Shareena Nusz, PA-C    

## 2023-03-09 NOTE — Telephone Encounter (Signed)
Called and left a message for call back to reschedule EGD

## 2023-03-09 NOTE — Progress Notes (Signed)
We are sorry that you are not feeling well.  Here is how we plan to help!  Based on what you have shared with me it looks like you mostly have acute back pain.  Acute back pain is defined as musculoskeletal pain that can resolve in 1-3 weeks with conservative treatment.  I have prescribed Naprosyn 500 mg take one by mouth twice a day non-steroid anti-inflammatory (NSAID) as well as Tizanidine 2 mg every eight hours as needed which is a muscle relaxer  Some patients experience stomach irritation or in increased heartburn with anti-inflammatory drugs.  Please keep in mind that muscle relaxer's can cause fatigue and should not be taken while at work or driving.  Back pain is very common.  The pain often gets better over time.  The cause of back pain is usually not dangerous.  Most people can learn to manage their back pain on their own.  If not quickly improving or you note any further change to urinary stream, you need an in-person evaluation ASAP as this can be a sign of nerve compression.   Home Care Stay active.  Start with short walks on flat ground if you can.  Try to walk farther each day. Do not sit, drive or stand in one place for more than 30 minutes.  Do not stay in bed. Do not avoid exercise or work.  Activity can help your back heal faster. Be careful when you bend or lift an object.  Bend at your knees, keep the object close to you, and do not twist. Sleep on a firm mattress.  Lie on your side, and bend your knees.  If you lie on your back, put a pillow under your knees. Only take medicines as told by your doctor. Put ice on the injured area. Put ice in a plastic bag Place a towel between your skin and the bag Leave the ice on for 15-20 minutes, 3-4 times a day for the first 2-3 days. 210 After that, you can switch between ice and heat packs. Ask your doctor about back exercises or massage. Avoid feeling anxious or stressed.  Find good ways to deal with stress, such as  exercise.  Get Help Right Way If: Your pain does not go away with rest or medicine. Your pain does not go away in 1 week. You have new problems. You do not feel well. The pain spreads into your legs. You cannot control when you poop (bowel movement) or pee (urinate) You feel sick to your stomach (nauseous) or throw up (vomit) You have belly (abdominal) pain. You feel like you may pass out (faint). If you develop a fever.  Make Sure you: Understand these instructions. Will watch your condition Will get help right away if you are not doing well or get worse.  Your e-visit answers were reviewed by a board certified advanced clinical practitioner to complete your personal care plan.  Depending on the condition, your plan could have included both over the counter or prescription medications.  If there is a problem please reply  once you have received a response from your provider.  Your safety is important to [place].  If you have drug allergies check your prescription carefully.    You can use MyChart to ask questions about today's visit, request a non-urgent call back, or ask for a work or school excuse for 24 hours related to this e-Visit. If it has been greater than 24 hours you will need to follow up with  your provider, or enter a new e-Visit to address those concerns.  You will get an e-mail in the next two days asking about your experience.  I hope that your e-visit has been valuable and will speed your recovery. Thank you for using e-visits.

## 2023-03-09 NOTE — Progress Notes (Signed)
Message sent to patient requesting further input regarding current symptoms. Awaiting patient response.  

## 2023-03-22 ENCOUNTER — Ambulatory Visit: Payer: BC Managed Care – PPO | Admitting: Gastroenterology

## 2023-06-02 ENCOUNTER — Emergency Department
Admission: EM | Admit: 2023-06-02 | Discharge: 2023-06-02 | Discharge status: Against Medical Advice | Payer: BC Managed Care – PPO

## 2023-06-02 NOTE — ED Notes (Signed)
First nurse note: Pt has clear voice, is swallowing saliva but it "comes back up" after he swallows it. States throat feels swollen.

## 2023-06-02 NOTE — ED Triage Notes (Signed)
PT arrives via POV. PT reports he has esophagitis. PT states he had trouble swallowing for the past three hours. He states that resolved about 5 mins ago. Pt denies any trouble breathing, states he feels back to normal. Pt made the determination that he did not want to be seen since the symptoms resolved. Pt AxOx4. Departed the ED without any further triage complete.

## 2023-07-20 ENCOUNTER — Ambulatory Visit (INDEPENDENT_AMBULATORY_CARE_PROVIDER_SITE_OTHER): Payer: BC Managed Care – PPO | Admitting: Nurse Practitioner

## 2023-07-20 ENCOUNTER — Encounter: Payer: Self-pay | Admitting: Nurse Practitioner

## 2023-07-20 VITALS — BP 100/68 | HR 64 | Temp 98.3°F | Resp 16 | Ht 72.0 in | Wt 228.0 lb

## 2023-07-20 DIAGNOSIS — E291 Testicular hypofunction: Secondary | ICD-10-CM | POA: Diagnosis not present

## 2023-07-20 DIAGNOSIS — E782 Mixed hyperlipidemia: Secondary | ICD-10-CM

## 2023-07-20 DIAGNOSIS — E559 Vitamin D deficiency, unspecified: Secondary | ICD-10-CM

## 2023-07-20 DIAGNOSIS — M129 Arthropathy, unspecified: Secondary | ICD-10-CM | POA: Insufficient documentation

## 2023-07-20 DIAGNOSIS — Z0000 Encounter for general adult medical examination without abnormal findings: Secondary | ICD-10-CM

## 2023-07-20 DIAGNOSIS — R7301 Impaired fasting glucose: Secondary | ICD-10-CM | POA: Insufficient documentation

## 2023-07-20 MED ORDER — CARISOPRODOL 350 MG PO TABS: 350.0000 mg | ORAL_TABLET | Freq: Every day | ORAL | 1 refills | Status: DC | PRN

## 2023-07-20 NOTE — Progress Notes (Signed)
[facility] [address] [place], [place] [address]  Internal MEDICINE  Office Visit Note  Patient Name: Vincent Huffman  [id number]  [id number]  Date of Service: 07/20/2023  Chief Complaint  Patient presents with   Gastroesophageal Reflux   Follow-up    Labs review    HPI Vincent Huffman presents for a follow-up visit for routine labs and refills.  Due for routine labs Feeling well, taking care of himself better now.  Due for medication refills. Takes soma as needed for arthritis and muscle spasms.  Hypogonadism -- low testosterone levels.    Current Medication: Outpatient Encounter Medications as of 07/20/2023  Medication Sig   carisoprodol (SOMA) 350 MG tablet Take 1 tablet (350 mg total) by mouth daily as needed for muscle spasms.   cyclobenzaprine (FLEXERIL) 10 MG tablet Take 1 tablet (10 mg total) by mouth at bedtime. Take one tab po qhs for back spasm prn only   naproxen (NAPROSYN) 500 MG tablet Take 1 tablet (500 mg total) by mouth 2 (two) times daily with a meal.   omeprazole (PRILOSEC) 40 MG capsule Take 1 capsule (40 mg total) by mouth 2 (two) times daily before a meal.   [DISCONTINUED] hyoscyamine (ANASPAZ) 0.125 MG TBDP disintergrating tablet Place 1 tablet (0.125 mg total) under the tongue every 6 (six) hours as needed for cramping.   [DISCONTINUED] rizatriptan (MAXALT) 5 MG tablet Take 1 tablet (5 mg total) by mouth as needed (tension headache). May repeat in 2 hours if needed   [DISCONTINUED] tiZANidine (ZANAFLEX) 2 MG tablet Take 1 tablet (2 mg total) by mouth every 6 (six) hours as needed for muscle spasms.   [DISCONTINUED] ranitidine (ZANTAC) 150 MG capsule Take 1 capsule (150 mg total) by mouth 2 (two) times daily.   No facility-administered encounter medications on file as of 07/20/2023.    Surgical History: Past Surgical History:  Procedure Laterality Date   ADENOIDECTOMY     COLONOSCOPY WITH PROPOFOL N/A 10/22/2022   Procedure: COLONOSCOPY WITH  PROPOFOL;  Surgeon: Toney Reil, MD;  Location: [facility];  Service: Endoscopy;  Laterality: N/A;   ESOPHAGOGASTRODUODENOSCOPY N/A 02/28/2020   Procedure: ESOPHAGOGASTRODUODENOSCOPY (EGD);  Surgeon: Toledo, Boykin Nearing, MD;  Location: [facility] END[facility];  Service: Gastroenterology;  Laterality: N/A;   ESOPHAGOGASTRODUODENOSCOPY (EGD) WITH PROPOFOL N/A 10/22/2022   Procedure: ESOPHAGOGASTRODUODENOSCOPY (EGD) WITH PROPOFOL;  Surgeon: Toney Reil, MD;  Location: [facility];  Service: Endoscopy;  Laterality: N/A;   TONSILLECTOMY      Medical History: Past Medical History:  Diagnosis Date   Chronic reflux esophagitis    Complication of anesthesia    "spasms" during EGD   Degenerative joint disease (DJD) of lumbar spine    Esophagitis    GERD (gastroesophageal reflux disease)     Family History: Family History  Problem Relation Age of Onset   Heart disease Mother    Heart disease Father    Diabetes Maternal Grandfather     Social History   Socioeconomic History   Marital status: Single    Spouse name: Not on file   Number of children: Not on file   Years of education: Not on file   Highest education level: Not on file  Occupational History   Occupation: Working Personnel officer   Tobacco Use   Smoking status: Never   Smokeless tobacco: Never  Vaping Use   Vaping status: Never Used  Substance and Sexual Activity   Alcohol use: No   Drug use: No   Sexual  activity: Not on file  Other Topics Concern   Not on file  Social History Narrative   Not on file   Social Drivers of Health   Financial Resource Strain: High Risk (01/11/2018)   Overall Financial Resource Strain (CARDIA)    Difficulty of Paying Living Expenses: Hard  Food Insecurity: Food Insecurity Present (01/11/2018)   Hunger Vital Sign    Worried About Running Out of Food in the Last Year: Sometimes true    Ran Out of Food in the Last Year: Sometimes true  Transportation Needs: No  Transportation Needs (01/11/2018)   PRAPARE - Administrator, Civil Service (Medical): No    Lack of Transportation (Non-Medical): No  Physical Activity: Sufficiently Active (01/11/2018)   Exercise Vital Sign    Days of Exercise per Week: 5 days    Minutes of Exercise per Session: 60 min  Stress: No Stress Concern Present (01/11/2018)   [employer] of Occupational Health - Occupational Stress Questionnaire    Feeling of Stress : Only a little  Social Connections: Moderately Isolated (01/11/2018)   Social Connection and Isolation Panel [NHANES]    Frequency of Communication with Friends and Family: Once a week    Frequency of Social Gatherings with Friends and Family: Once a week    Attends Religious Services: More than 4 times per year    Active Member of [employer] or Organizations: No    Attends Banker Meetings: Never    Marital Status: Never married  Intimate Partner Violence: Unknown (01/11/2018)   Humiliation, Afraid, Rape, and Kick questionnaire    Fear of Current or Ex-Partner: Patient declined    Emotionally Abused: Patient declined    Physically Abused: Patient declined    Sexually Abused: Patient declined      Review of Systems  Constitutional:  Negative for fatigue.  HENT: Negative.    Respiratory: Negative.  Negative for cough, chest tightness, shortness of breath and wheezing.   Cardiovascular: Negative.  Negative for chest pain (related to heartburn) and palpitations.  Neurological: Negative.     Vital Signs: BP 100/68   Pulse 64   Temp 98.3 F (36.8 C)   Resp 16   Ht 6' (1.829 m)   Wt 228 lb (103.4 kg)   SpO2 97%   BMI 30.92 kg/m    Physical Exam Vitals reviewed.  Constitutional:      Appearance: Normal appearance. He is not ill-appearing.  HENT:     Head: Normocephalic and atraumatic.  Eyes:     Pupils: Pupils are equal, round, and reactive to light.  Cardiovascular:     Rate and Rhythm: Normal rate and regular rhythm.   Pulmonary:     Effort: Pulmonary effort is normal. No respiratory distress.  Neurological:     Mental Status: He is alert and oriented to person, place, and time.  Psychiatric:        Mood and Affect: Mood normal.        Behavior: Behavior normal.        Assessment/Plan: 1. Arthritis/arthropathy of multiple joints (Primary) Continue soma as needed as prescribed.  - carisoprodol (SOMA) 350 MG tablet; Take 1 tablet (350 mg total) by mouth daily as needed for muscle spasms.  Dispense: 30 tablet; Refill: 1  2. Impaired fasting glucose Routine labs ordered  - CBC with Differential/Platelet - CMP14+EGFR - Lipid Profile - Hgb A1C w/o eAG - Iron, TIBC and Ferritin Panel  3. Hypogonadism in male Routine lab ordered  -  Testosterone,Free and Total  4. Mixed hyperlipidemia Routine labs ordered  - CBC with Differential/Platelet - CMP14+EGFR - Lipid Profile - Hgb A1C w/o eAG - Iron, TIBC and Ferritin Panel  5. Vitamin D deficiency Routine lab ordered  - Vitamin D (25 hydroxy)  6. Healthcare maintenance Routine labs ordered  - CBC with Differential/Platelet - CMP14+EGFR - Testosterone,Free and Total - Lipid Profile - Vitamin D (25 hydroxy) - Hgb A1C w/o eAG - Iron, TIBC and Ferritin Panel   General Counseling: Florian verbalizes understanding of the findings of todays visit and agrees with plan of treatment. I have discussed any further diagnostic evaluation that may be needed or ordered today. We also reviewed his medications today. he has been encouraged to call the office with any questions or concerns that should arise related to todays visit.    Orders Placed This Encounter  Procedures   CBC with Differential/Platelet   CMP14+EGFR   Testosterone,Free and Total   Lipid Profile   Vitamin D (25 hydroxy)   Hgb A1C w/o eAG   Iron, TIBC and Ferritin Panel    Meds ordered this encounter  Medications   carisoprodol (SOMA) 350 MG tablet    Sig: Take 1 tablet (350  mg total) by mouth daily as needed for muscle spasms.    Dispense:  30 tablet    Refill:  1    Fill new script    Return for CPE, Gustie Bobb PCP in june.   Total time spent:30 Minutes Time spent includes review of chart, medications, test results, and follow up plan with the patient.   [place] Controlled Substance Database was reviewed by me.  This patient was seen by Sallyanne Kuster, FNP-C in collaboration with Dr. Beverely Risen as a part of collaborative care agreement.   Lajune Perine R. Tedd Sias, MSN, FNP-C Internal medicine

## 2023-08-03 LAB — LIPID PANEL
Chol/HDL Ratio: 4.5 {ratio} (ref 0.0–5.0)
Cholesterol, Total: 214 mg/dL — ABNORMAL HIGH (ref 100–199)
HDL: 48 mg/dL (ref >39)
LDL Chol Calc (NIH): 136 mg/dL — ABNORMAL HIGH (ref 0–99)
Triglycerides: 165 mg/dL — ABNORMAL HIGH (ref 0–149)
VLDL Cholesterol Cal: 30 mg/dL (ref 5–40)

## 2023-08-03 LAB — CBC WITH DIFFERENTIAL/PLATELET
Basophils Absolute: 0 10*3/uL (ref 0.0–0.2)
Basos: 1 %
EOS (ABSOLUTE): 0.2 10*3/uL (ref 0.0–0.4)
Eos: 3 %
Hematocrit: 46.1 % (ref 37.5–51.0)
Hemoglobin: 14.8 g/dL (ref 13.0–17.7)
Immature Grans (Abs): 0 10*3/uL (ref 0.0–0.1)
Immature Granulocytes: 0 %
Lymphocytes Absolute: 2.1 10*3/uL (ref 0.7–3.1)
Lymphs: 32 %
MCH: 29.2 pg (ref 26.6–33.0)
MCHC: 32.1 g/dL (ref 31.5–35.7)
MCV: 91 fL (ref 79–97)
Monocytes Absolute: 0.5 10*3/uL (ref 0.1–0.9)
Monocytes: 7 %
Neutrophils Absolute: 3.8 10*3/uL (ref 1.4–7.0)
Neutrophils: 57 %
Platelets: 285 10*3/uL (ref 150–450)
RBC: 5.07 x10E6/uL (ref 4.14–5.80)
RDW: 12.4 % (ref 11.6–15.4)
WBC: 6.6 10*3/uL (ref 3.4–10.8)

## 2023-08-03 LAB — CMP14+EGFR
ALT: 97 [IU]/L — ABNORMAL HIGH (ref 0–44)
AST: 61 [IU]/L — ABNORMAL HIGH (ref 0–40)
Albumin: 4.7 g/dL (ref 4.1–5.1)
Alkaline Phosphatase: 65 [IU]/L (ref 44–121)
BUN/Creatinine Ratio: 9 (ref 9–20)
BUN: 9 mg/dL (ref 6–24)
Bilirubin Total: 0.9 mg/dL (ref 0.0–1.2)
CO2: 24 mmol/L (ref 20–29)
Calcium: 9.2 mg/dL (ref 8.7–10.2)
Chloride: 103 mmol/L (ref 96–106)
Creatinine, Ser: 1.02 mg/dL (ref 0.76–1.27)
Globulin, Total: 2 g/dL (ref 1.5–4.5)
Glucose: 100 mg/dL — ABNORMAL HIGH (ref 70–99)
Potassium: 4.5 mmol/L (ref 3.5–5.2)
Sodium: 142 mmol/L (ref 134–144)
Total Protein: 6.7 g/dL (ref 6.0–8.5)
eGFR: 92 mL/min/{1.73_m2} (ref >59)

## 2023-08-03 LAB — VITAMIN D 25 HYDROXY (VIT D DEFICIENCY, FRACTURES): Vit D, 25-Hydroxy: 23 ng/mL — ABNORMAL LOW (ref 30.0–100.0)

## 2023-08-03 LAB — IRON,TIBC AND FERRITIN PANEL
Ferritin: 292 ng/mL (ref 30–400)
Iron Saturation: 39 % (ref 15–55)
Iron: 127 ug/dL (ref 38–169)
Total Iron Binding Capacity: 328 ug/dL (ref 250–450)
UIBC: 201 ug/dL (ref 111–343)

## 2023-08-03 LAB — HGB A1C W/O EAG: Hgb A1c MFr Bld: 5.3 % (ref 4.8–5.6)

## 2023-08-03 LAB — TESTOSTERONE,FREE AND TOTAL
Testosterone, Free: 18.9 pg/mL (ref 6.8–21.5)
Testosterone: 598 ng/dL (ref 264–916)

## 2023-08-10 ENCOUNTER — Ambulatory Visit
Admission: EM | Admit: 2023-08-10 | Discharge: 2023-08-10 | Disposition: A | Discharge status: Home / Self Care | Payer: 59 | Attending: Emergency Medicine | Admitting: Emergency Medicine

## 2023-08-10 ENCOUNTER — Encounter
Admission: EM | Disposition: A | Discharge status: Home / Self Care | Payer: Self-pay | Source: Home / Self Care | Attending: Emergency Medicine

## 2023-08-10 ENCOUNTER — Other Ambulatory Visit: Payer: Self-pay

## 2023-08-10 ENCOUNTER — Emergency Department: Payer: 59 | Admitting: Certified Registered"

## 2023-08-10 ENCOUNTER — Encounter: Payer: Self-pay | Admitting: Emergency Medicine

## 2023-08-10 DIAGNOSIS — T18128A Food in esophagus causing other injury, initial encounter: Secondary | ICD-10-CM

## 2023-08-10 DIAGNOSIS — K222 Esophageal obstruction: Secondary | ICD-10-CM | POA: Diagnosis not present

## 2023-08-10 DIAGNOSIS — K2100 Gastro-esophageal reflux disease with esophagitis, without bleeding: Secondary | ICD-10-CM | POA: Insufficient documentation

## 2023-08-10 DIAGNOSIS — W44F3XA Food entering into or through a natural orifice, initial encounter: Secondary | ICD-10-CM | POA: Diagnosis not present

## 2023-08-10 HISTORY — PX: IMPACTION REMOVAL: SHX5858

## 2023-08-10 HISTORY — PX: ESOPHAGOGASTRODUODENOSCOPY (EGD) WITH PROPOFOL: SHX5813

## 2023-08-10 LAB — BASIC METABOLIC PANEL
Anion gap: 16 — ABNORMAL HIGH (ref 5–15)
BUN: 16 mg/dL (ref 6–20)
CO2: 21 mmol/L — ABNORMAL LOW (ref 22–32)
Calcium: 9.8 mg/dL (ref 8.9–10.3)
Chloride: 102 mmol/L (ref 98–111)
Creatinine, Ser: 0.94 mg/dL (ref 0.61–1.24)
GFR, Estimated: >60 mL/min (ref >60)
Glucose, Bld: 82 mg/dL (ref 70–99)
Potassium: 4.2 mmol/L (ref 3.5–5.1)
Sodium: 139 mmol/L (ref 135–145)

## 2023-08-10 LAB — CBC WITH DIFFERENTIAL/PLATELET
Abs Immature Granulocytes: 0.03 10*3/uL (ref 0.00–0.07)
Basophils Absolute: 0 10*3/uL (ref 0.0–0.1)
Basophils Relative: 1 %
Eosinophils Absolute: 0 10*3/uL (ref 0.0–0.5)
Eosinophils Relative: 0 %
HCT: 45.3 % (ref 39.0–52.0)
Hemoglobin: 15.4 g/dL (ref 13.0–17.0)
Immature Granulocytes: 0 %
Lymphocytes Relative: 19 %
Lymphs Abs: 1.6 10*3/uL (ref 0.7–4.0)
MCH: 31 pg (ref 26.0–34.0)
MCHC: 34 g/dL (ref 30.0–36.0)
MCV: 91.3 fL (ref 80.0–100.0)
Monocytes Absolute: 0.4 10*3/uL (ref 0.1–1.0)
Monocytes Relative: 4 %
Neutro Abs: 6.4 10*3/uL (ref 1.7–7.7)
Neutrophils Relative %: 76 %
Platelets: 280 10*3/uL (ref 150–400)
RBC: 4.96 MIL/uL (ref 4.22–5.81)
RDW: 12.6 % (ref 11.5–15.5)
WBC: 8.4 10*3/uL (ref 4.0–10.5)
nRBC: 0 % (ref 0.0–0.2)

## 2023-08-10 SURGERY — ESOPHAGOGASTRODUODENOSCOPY (EGD) WITH PROPOFOL
Anesthesia: General

## 2023-08-10 MED ORDER — ONDANSETRON HCL 4 MG/2ML IJ SOLN
INTRAMUSCULAR | Status: DC | PRN
  Administered 2023-08-10: 4 mg via INTRAVENOUS

## 2023-08-10 MED ORDER — SODIUM CHLORIDE 0.9 % IV SOLN: INTRAVENOUS | Status: DC

## 2023-08-10 MED ORDER — PROPOFOL 10 MG/ML IV BOLUS
INTRAVENOUS | Status: AC
  Filled 2023-08-10: qty 20

## 2023-08-10 MED ORDER — PROPOFOL 10 MG/ML IV BOLUS
INTRAVENOUS | Status: DC | PRN
  Administered 2023-08-10: 150 mg via INTRAVENOUS

## 2023-08-10 MED ORDER — GLUCAGON HCL RDNA (DIAGNOSTIC) 1 MG IJ SOLR
1.0000 mg | Freq: Once | INTRAMUSCULAR | Status: AC
  Administered 2023-08-10: 1 mg via INTRAVENOUS
  Filled 2023-08-10: qty 1

## 2023-08-10 MED ORDER — FENTANYL CITRATE (PF) 100 MCG/2ML IJ SOLN
INTRAMUSCULAR | Status: AC
  Filled 2023-08-10: qty 2

## 2023-08-10 MED ORDER — FENTANYL CITRATE (PF) 100 MCG/2ML IJ SOLN
INTRAMUSCULAR | Status: DC | PRN
  Administered 2023-08-10 (×2): 25 ug via INTRAVENOUS
  Administered 2023-08-10: 50 ug via INTRAVENOUS

## 2023-08-10 MED ORDER — LIDOCAINE HCL (CARDIAC) PF 100 MG/5ML IV SOSY
PREFILLED_SYRINGE | INTRAVENOUS | Status: DC | PRN
  Administered 2023-08-10: 100 mg via INTRAVENOUS

## 2023-08-10 MED ORDER — SUCCINYLCHOLINE CHLORIDE 200 MG/10ML IV SOSY
PREFILLED_SYRINGE | INTRAVENOUS | Status: DC | PRN
  Administered 2023-08-10: 100 mg via INTRAVENOUS

## 2023-08-10 MED ORDER — DEXMEDETOMIDINE HCL IN NACL 80 MCG/20ML IV SOLN
INTRAVENOUS | Status: DC | PRN
  Administered 2023-08-10: 8 ug via INTRAVENOUS

## 2023-08-10 MED ORDER — PANTOPRAZOLE SODIUM 40 MG IV SOLR
40.0000 mg | Freq: Once | INTRAVENOUS | Status: AC
  Administered 2023-08-10: 40 mg via INTRAVENOUS
  Filled 2023-08-10: qty 10

## 2023-08-10 NOTE — Transfer of Care (Signed)
 Immediate Anesthesia Transfer of Care Note  Patient: Vincent Huffman  Procedure(s) Performed: ESOPHAGOGASTRODUODENOSCOPY (EGD) WITH PROPOFOL   Patient Location: PACU and Endoscopy Unit  Anesthesia Type:General  Level of Consciousness: drowsy and patient cooperative  Airway & Oxygen Therapy: Patient Spontanous Breathing and Patient connected to face mask oxygen  Post-op Assessment: Report given to RN and Post -op Vital signs reviewed and stable  Post vital signs: Reviewed and stable  Last Vitals:  Vitals Value Taken Time  BP 145/81 08/10/23 1521  Temp    Pulse 78 08/10/23 1521  Resp    SpO2 100 % 08/10/23 1521  Vitals shown include unfiled device data.  Last Pain:  Vitals:   08/10/23 1521  TempSrc:   PainSc: Asleep         Complications: No notable events documented.

## 2023-08-10 NOTE — ED Provider Notes (Signed)
 [facility] Provider Note    Event Date/Time   First MD Initiated Contact with Patient 08/10/23 1159     (approximate)   History   Esophageal food impaction   HPI  Vincent Huffman is a 48 y.o. 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  Patient reports last night he was eating shredded coconut and developed pain and inability to tolerate p.o.'s.  He has been unable to tolerate p.o.'s all night long.     Physical Exam   Triage Vital Signs: ED Triage Vitals  Encounter Vitals Group     BP 08/10/23 1023 (!) 148/79     Systolic BP Percentile --      Diastolic BP Percentile --      Pulse Rate 08/10/23 1023 93     Resp 08/10/23 1023 16     Temp 08/10/23 1023 98.3 F (36.8 C)     Temp Source 08/10/23 1023 Oral     SpO2 08/10/23 1023 97 %     Weight 08/10/23 1023 99.8 kg (220 lb)     Height 08/10/23 1023 1.829 m (6')     Head Circumference --      Peak Flow --      Pain Score 08/10/23 1028 7     Pain Loc --      Pain Education --      Exclude from Growth Chart --     Most recent vital signs: Vitals:   08/10/23 1023  BP: (!) 148/79  Pulse: 93  Resp: 16  Temp: 98.3 F (36.8 C)  SpO2: 97%     General: Awake, no distress.  CV:  Good peripheral perfusion.  Resp:  Normal effort.  Abd:  No distention.  Other:     ED Results / Procedures / Treatments   Labs (all labs ordered are listed, but only abnormal results are displayed) Labs Reviewed  BASIC METABOLIC PANEL - Abnormal; Notable for the following components:      Result Value   CO2 21 (*)    Anion gap 16 (*)    All other components within normal limits  CBC WITH DIFFERENTIAL/PLATELET     EKG     RADIOLOGY     PROCEDURES:  Critical Care performed:   Procedures   MEDICATIONS ORDERED IN ED: Medications  pantoprazole  (PROTONIX ) injection 40  mg (40 mg Intravenous Given 08/10/23 1303)  glucagon  (human recombinant) (GLUCAGEN ) injection 1 mg (1 mg Intravenous Given 08/10/23 1305)     IMPRESSION / MDM / ASSESSMENT AND PLAN / ED COURSE  I reviewed the triage vital signs and the nursing notes. Patient's presentation is most consistent with acute presentation with potential threat to life or bodily function.  Patient presents with inability to tolerate p.o.'s, likely esophageal impaction.  Will trial IV Protonix , IV glucagon  which has helped him in the past.  ----------------------------------------- 1:51 PM on 08/10/2023 -----------------------------------------  Remains unable to tolerate liquids, will consult GI  Dr. Jinny will take the patient for EGD      FINAL CLINICAL IMPRESSION(S) / ED DIAGNOSES   Final diagnoses:  Esophageal obstruction due to food impaction     Rx / DC Orders   ED Discharge Orders     None        Note:  This document was prepared using Dragon voice recognition software and may include unintentional dictation errors.  Arlander Charleston, MD 08/10/23 1356

## 2023-08-10 NOTE — Op Note (Signed)
 [facility] Gastroenterology Patient Name: Vincent Huffman Procedure Date: 08/10/2023 1:59 PM MRN: [id number] Account #: [id number] Date of Birth: 1976/03/04 Admit Type: Outpatient Age: 48 Room: [facility] ENDO ROOM 4 Gender: Male Note Status: Finalized Instrument Name: Upper Endoscope [id number] Procedure:             Upper GI endoscopy Indications:           Foreign body in the esophagus Providers:             Rogelia Copping MD, MD Referring MD:          Mardy Maxin (Referring MD) Medicines:             General Anesthesia Complications:         No immediate complications. Procedure:             Pre-Anesthesia Assessment:                        - Prior to the procedure, a History and Physical was                         performed, and patient medications and allergies were                         reviewed. The patient's tolerance of previous                         anesthesia was also reviewed. The risks and benefits                         of the procedure and the sedation options and risks                         were discussed with the patient. All questions were                         answered, and informed consent was obtained. Prior                         Anticoagulants: The patient has taken no anticoagulant                         or antiplatelet agents. ASA Grade Assessment: II - A                         patient with mild systemic disease. After reviewing                         the risks and benefits, the patient was deemed in                         satisfactory condition to undergo the procedure.                        After obtaining informed consent, the endoscope was                         passed under direct vision. Throughout the procedure,  the patient's blood pressure, pulse, and oxygen                         saturations were monitored continuously. The Endoscope                         was introduced through the mouth, and  advanced to the                         second part of duodenum. The upper GI endoscopy was                         accomplished without difficulty. The patient tolerated                         the procedure well. Findings:      Food was found at the gastroesophageal junction. Removal was       accomplished with a Roth net and Tripod.      One benign-appearing, intrinsic moderate stenosis was found at the       gastroesophageal junction. The stenosis was traversed.      The stomach was normal.      The examined duodenum was normal. Impression:            - Food at the gastroesophageal junction. Removal was                         successful.                        - Benign-appearing esophageal stenosis.                        - Normal stomach.                        - Normal examined duodenum. Recommendation:        - Discharge patient to home.                        - Mechanical soft diet.                        - Repeat upper endoscopy in 4 weeks per protocol. Procedure Code(s):     --- Professional ---                        [phone], Esophagogastroduodenoscopy, flexible,                         transoral; with removal of foreign body(s) Diagnosis Code(s):     --- Professional ---                        T18.108A, Unspecified foreign body in esophagus                         causing other injury, initial encounter                        K22.2, Esophageal obstruction CPT copyright 2022 American Medical Association. All rights reserved. The codes  documented in this report are preliminary and upon coder review may  be revised to meet current compliance requirements. Rogelia Copping MD, MD 08/10/2023 3:10:34 PM This report has been signed electronically. Number of Addenda: 0 Note Initiated On: 08/10/2023 1:59 PM Estimated Blood Loss:  Estimated blood loss: none.      [facility]

## 2023-08-10 NOTE — ED Triage Notes (Signed)
 Patient to ED via POV for coconut shreds stuck in throat x12 hours. States is able to swallow salvia but nothing else. States hx of same.  Given zofran at [facility] but pt reports that made it worse.

## 2023-08-10 NOTE — H&P (Signed)
 Vincent Copping, MD [facility] [address] [place], [place] [address] Phone:[phone] Fax : [phone]  Primary Care Physician:  Liana Fish, NP Primary Gastroenterologist:  Dr. Copping  Pre-Procedure History & Physical: HPI:  Vincent Huffman is a 48 y.o. male is here for an endoscopy.   Past Medical History:  Diagnosis Date   Chronic reflux esophagitis    Complication of anesthesia    spasms during EGD   Degenerative joint disease (DJD) of lumbar spine    Esophagitis    GERD (gastroesophageal reflux disease)     Past Surgical History:  Procedure Laterality Date   ADENOIDECTOMY     COLONOSCOPY WITH PROPOFOL  N/A 10/22/2022   Procedure: COLONOSCOPY WITH PROPOFOL ;  Surgeon: Unk Corinn Skiff, MD;  Location: [facility];  Service: Endoscopy;  Laterality: N/A;   ESOPHAGOGASTRODUODENOSCOPY N/A 02/28/2020   Procedure: ESOPHAGOGASTRODUODENOSCOPY (EGD);  Surgeon: Toledo, Ladell POUR, MD;  Location: [facility] END[facility];  Service: Gastroenterology;  Laterality: N/A;   ESOPHAGOGASTRODUODENOSCOPY (EGD) WITH PROPOFOL  N/A 10/22/2022   Procedure: ESOPHAGOGASTRODUODENOSCOPY (EGD) WITH PROPOFOL ;  Surgeon: Unk Corinn Skiff, MD;  Location: [facility];  Service: Endoscopy;  Laterality: N/A;   TONSILLECTOMY      Prior to Admission medications   Medication Sig Start Date End Date Taking? Authorizing Provider  omeprazole  (PRILOSEC ) 40 MG capsule Take 1 capsule (40 mg total) by mouth 2 (two) times daily before a meal. 10/26/22  Yes Vanga, Corinn Skiff, MD  carisoprodol  (SOMA ) 350 MG tablet Take 1 tablet (350 mg total) by mouth daily as needed for muscle spasms. 07/20/23   Liana Fish, NP  cyclobenzaprine  (FLEXERIL ) 10 MG tablet Take 1 tablet (10 mg total) by mouth at bedtime. Take one tab po qhs for back spasm prn only 01/15/22   Liana Fish, NP  naproxen  (NAPROSYN ) 500 MG tablet Take 1 tablet (500 mg total) by mouth 2 (two) times daily with a meal. 03/09/23   Gladis Elsie BROCKS, PA-C  ranitidine  (ZANTAC ) 150 MG capsule Take 1 capsule (150 mg total) by mouth 2 (two) times daily. 07/08/15 07/08/15  Viviann Pastor, MD    Allergies as of 08/10/2023 - Review Complete 08/10/2023  Allergen Reaction Noted   Iodinated contrast media Other (See Comments) 11/17/2017    Family History  Problem Relation Age of Onset   Heart disease Mother    Heart disease Father    Diabetes Maternal Grandfather     Social History   Socioeconomic History   Marital status: Single    Spouse name: Not on file   Number of children: Not on file   Years of education: Not on file   Highest education level: Not on file  Occupational History   Occupation: Working Personnel Officer   Tobacco Use   Smoking status: Never   Smokeless tobacco: Never  Vaping Use   Vaping status: Never Used  Substance and Sexual Activity   Alcohol use: No   Drug use: No   Sexual activity: Not on file  Other Topics Concern   Not on file  Social History Narrative   Not on file   Social Drivers of Health   Financial Resource Strain: High Risk (01/11/2018)   Overall Financial Resource Strain (CARDIA)    Difficulty of Paying Living Expenses: Hard  Food Insecurity: Food Insecurity Present (01/11/2018)   Hunger Vital Sign    Worried About Running Out of Food in the Last Year: Sometimes true    Ran Out of Food in the Last Year: Sometimes true  Transportation Needs: No Transportation Needs (01/11/2018)   PRAPARE - Administrator, Civil Service (Medical): No    Lack of Transportation (Non-Medical): No  Physical Activity: Sufficiently Active (01/11/2018)   Exercise Vital Sign    Days of Exercise per Week: 5 days    Minutes of Exercise per Session: 60 min  Stress: No Stress Concern Present (01/11/2018)   [employer] of Occupational Health - Occupational Stress Questionnaire    Feeling of Stress : Only a little  Social Connections: Moderately Isolated (01/11/2018)   Social Connection and  Isolation Panel [NHANES]    Frequency of Communication with Friends and Family: Once a week    Frequency of Social Gatherings with Friends and Family: Once a week    Attends Religious Services: More than 4 times per year    Active Member of [employer] or Organizations: No    Attends Banker Meetings: Never    Marital Status: Never married  Intimate Partner Violence: Unknown (01/11/2018)   Humiliation, Afraid, Rape, and Kick questionnaire    Fear of Current or Ex-Partner: Patient declined    Emotionally Abused: Patient declined    Physically Abused: Patient declined    Sexually Abused: Patient declined    Review of Systems: See HPI, otherwise negative ROS  Physical Exam: BP (!) 144/92   Pulse 81   Temp (!) 97.2 F (36.2 C) (Temporal)   Resp 18   Ht 6' (1.829 m)   Wt 99.8 kg   SpO2 100%   BMI 29.84 kg/m  General:   Alert,  pleasant and cooperative in NAD Head:  Normocephalic and atraumatic. Neck:  Supple; no masses or thyromegaly. Lungs:  Clear throughout to auscultation.    Heart:  Regular rate and rhythm. Abdomen:  Soft, nontender and nondistended. Normal bowel sounds, without guarding, and without rebound.   Neurologic:  Alert and  oriented x4;  grossly normal neurologically.  Impression/Plan: Vincent Huffman is here for an endoscopy to be performed for food bolus  Risks, benefits, limitations, and alternatives regarding  endoscopy have been reviewed with the patient.  Questions have been answered.  All parties agreeable.   Vincent Copping, MD  08/10/2023, 2:32 PM

## 2023-08-10 NOTE — Anesthesia Procedure Notes (Signed)
 Procedure Name: Intubation Date/Time: 08/10/2023 2:39 PM  Performed by: Germaine Maeola CROME, CRNAPre-anesthesia Checklist: Patient identified, Emergency Drugs available, Suction available and Patient being monitored Patient Re-evaluated:Patient Re-evaluated prior to induction Oxygen Delivery Method: Circle system utilized Preoxygenation: Pre-oxygenation with 100% oxygen Induction Type: IV induction and Rapid sequence Laryngoscope Size: McGrath and 4 Grade View: Grade I Tube type: Oral Tube size: 7.0 mm Number of attempts: 1 Airway Equipment and Method: Stylet Placement Confirmation: ETT inserted through vocal cords under direct vision, positive ETCO2 and breath sounds checked- equal and bilateral Secured at: 22 cm Tube secured with: Tape Dental Injury: Teeth and Oropharynx as per pre-operative assessment

## 2023-08-10 NOTE — Anesthesia Preprocedure Evaluation (Addendum)
 Anesthesia Evaluation  Patient identified by MRN, date of birth, ID band Patient awake    Reviewed: Allergy & Precautions, NPO status , Patient's Chart, lab work & pertinent test results  History of Anesthesia Complications (+) history of anesthetic complications  Airway Mallampati: III  TM Distance: >3 FB Neck ROM: full    Dental no notable dental hx.    Pulmonary neg pulmonary ROS   Pulmonary exam normal        Cardiovascular negative cardio ROS Normal cardiovascular exam     Neuro/Psych negative neurological ROS  negative psych ROS   GI/Hepatic Neg liver ROS,GERD  Medicated,,  Endo/Other  negative endocrine ROS    Renal/GU      Musculoskeletal  (+) Arthritis ,    Abdominal   Peds  Hematology negative hematology ROS (+)   Anesthesia Other Findings Past Medical History: No date: Chronic reflux esophagitis No date: Complication of anesthesia     Comment:  spasms during EGD No date: Degenerative joint disease (DJD) of lumbar spine No date: Esophagitis No date: GERD (gastroesophageal reflux disease)  Past Surgical History: No date: ADENOIDECTOMY 10/22/2022: COLONOSCOPY WITH PROPOFOL ; N/A     Comment:  Procedure: COLONOSCOPY WITH PROPOFOL ;  Surgeon: Unk Corinn Skiff, MD;  Location: [facility];                Service: Endoscopy;  Laterality: N/A; 02/28/2020: ESOPHAGOGASTRODUODENOSCOPY; N/A     Comment:  Procedure: ESOPHAGOGASTRODUODENOSCOPY (EGD);  Surgeon:               Toledo, Ladell POUR, MD;  Location: [facility];                Service: Gastroenterology;  Laterality: N/A; 10/22/2022: ESOPHAGOGASTRODUODENOSCOPY (EGD) WITH PROPOFOL ; N/A     Comment:  Procedure: ESOPHAGOGASTRODUODENOSCOPY (EGD) WITH               PROPOFOL ;  Surgeon: Unk Corinn Skiff, MD;  Location:               [facility];  Service: Endoscopy;  Laterality:               N/A; No date:  TONSILLECTOMY  BMI    Body Mass Index: 29.84 kg/m      Reproductive/Obstetrics negative OB ROS                             Anesthesia Physical Anesthesia Plan  ASA: 2 and emergent  Anesthesia Plan: General ETT   Post-op Pain Management: Minimal or no pain anticipated   Induction: Intravenous  PONV Risk Score and Plan: 2 and Ondansetron , Dexamethasone, Midazolam  and Treatment may vary due to age or medical condition  Airway Management Planned: Oral ETT  Additional Equipment:   Intra-op Plan:   Post-operative Plan: Extubation in OR  Informed Consent: I have reviewed the patients History and Physical, chart, labs and discussed the procedure including the risks, benefits and alternatives for the proposed anesthesia with the patient or authorized representative who has indicated his/her understanding and acceptance.     Dental Advisory Given  Plan Discussed with: Anesthesiologist, CRNA and Surgeon  Anesthesia Plan Comments: (Patient consented for risks of anesthesia including but not limited to:  - adverse reactions to medications - damage to eyes, teeth, lips or other oral mucosa - nerve damage due to positioning  - sore  throat or hoarseness - Damage to heart, brain, nerves, lungs, other parts of body or loss of life  Patient voiced understanding and assent.)        Anesthesia Quick Evaluation

## 2023-08-11 ENCOUNTER — Encounter: Payer: Self-pay | Admitting: Gastroenterology

## 2023-08-12 NOTE — Anesthesia Postprocedure Evaluation (Signed)
 Anesthesia Post Note  Patient: Vincent Huffman  Procedure(s) Performed: ESOPHAGOGASTRODUODENOSCOPY (EGD) WITH PROPOFOL  IMPACTION REMOVAL  Patient location during evaluation: Endoscopy Anesthesia Type: General Level of consciousness: awake and alert Pain management: pain level controlled Vital Signs Assessment: post-procedure vital signs reviewed and stable Respiratory status: spontaneous breathing, nonlabored ventilation, respiratory function stable and patient connected to nasal cannula oxygen Cardiovascular status: blood pressure returned to baseline and stable Postop Assessment: no apparent nausea or vomiting Anesthetic complications: no   No notable events documented.   Last Vitals:  Vitals:   08/10/23 1536 08/10/23 1541  BP: 138/85 133/84  Pulse: 80 81  Resp:    Temp:    SpO2: 96% 97%    Last Pain:  Vitals:   08/10/23 1541  TempSrc:   PainSc: 0-No pain                 Lendia LITTIE Mae

## 2023-09-22 ENCOUNTER — Telehealth: Payer: Self-pay

## 2023-09-22 NOTE — Telephone Encounter (Signed)
Schedule repeat egd  Left message on voicemail

## 2023-10-04 NOTE — Telephone Encounter (Signed)
 Left message on voicemail.

## 2023-10-06 NOTE — Telephone Encounter (Signed)
Left message on voicemail  Unable to contact letter mailed

## 2023-10-27 ENCOUNTER — Telehealth: Payer: Self-pay

## 2023-10-27 NOTE — Telephone Encounter (Signed)
 Submitted PA through cover my meds for the medication Eohilia. Waiting on response from insurance company. Patient is due for  repeat EGD in 4 weeks from 02/07/2024 and needs appointment with Dr. Allegra Lai. Called and left a message for call back

## 2023-11-04 ENCOUNTER — Telehealth: Payer: Self-pay | Admitting: Nurse Practitioner

## 2023-11-04 NOTE — Telephone Encounter (Signed)
 Patient called requesting sick visit for today. I explained we don't have any available appts until Monday. Patient may go to urgent care-[facility]

## 2023-11-17 ENCOUNTER — Other Ambulatory Visit: Payer: Self-pay | Admitting: Nurse Practitioner

## 2023-11-17 DIAGNOSIS — Z20818 Contact with and (suspected) exposure to other bacterial communicable diseases: Secondary | ICD-10-CM

## 2023-11-17 NOTE — Progress Notes (Signed)
 Left message for patient to give office a call.

## 2023-11-17 NOTE — Progress Notes (Signed)
 Patient notified

## 2023-11-18 ENCOUNTER — Encounter: Payer: Self-pay | Admitting: Nurse Practitioner

## 2023-11-18 ENCOUNTER — Ambulatory Visit (INDEPENDENT_AMBULATORY_CARE_PROVIDER_SITE_OTHER): Payer: Self-pay | Admitting: Nurse Practitioner

## 2023-11-18 VITALS — BP 124/76 | HR 102 | Temp 98.5°F | Resp 16 | Ht 72.0 in | Wt 228.0 lb

## 2023-11-18 DIAGNOSIS — Z20818 Contact with and (suspected) exposure to other bacterial communicable diseases: Secondary | ICD-10-CM | POA: Diagnosis not present

## 2023-11-18 DIAGNOSIS — M129 Arthropathy, unspecified: Secondary | ICD-10-CM

## 2023-11-18 DIAGNOSIS — G4709 Other insomnia: Secondary | ICD-10-CM

## 2023-11-18 LAB — CMP14+EGFR
ALT: 28 IU/L (ref 0–44)
AST: 21 IU/L (ref 0–40)
Albumin: 4.5 g/dL (ref 4.1–5.1)
Alkaline Phosphatase: 88 IU/L (ref 44–121)
BUN/Creatinine Ratio: 10 (ref 9–20)
BUN: 9 mg/dL (ref 6–24)
Bilirubin Total: 0.4 mg/dL (ref 0.0–1.2)
CO2: 23 mmol/L (ref 20–29)
Calcium: 9.3 mg/dL (ref 8.7–10.2)
Chloride: 104 mmol/L (ref 96–106)
Creatinine, Ser: 0.87 mg/dL (ref 0.76–1.27)
Globulin, Total: 1.7 g/dL (ref 1.5–4.5)
Glucose: 105 mg/dL — ABNORMAL HIGH (ref 70–99)
Potassium: 4.2 mmol/L (ref 3.5–5.2)
Sodium: 142 mmol/L (ref 134–144)
Total Protein: 6.2 g/dL (ref 6.0–8.5)
eGFR: 107 mL/min/{1.73_m2} (ref >59)

## 2023-11-18 LAB — CBC WITH DIFFERENTIAL/PLATELET
Basophils Absolute: 0.1 10*3/uL (ref 0.0–0.2)
Basos: 1 %
EOS (ABSOLUTE): 0.3 10*3/uL (ref 0.0–0.4)
Eos: 4 %
Hematocrit: 42.5 % (ref 37.5–51.0)
Hemoglobin: 14.3 g/dL (ref 13.0–17.7)
Immature Grans (Abs): 0 10*3/uL (ref 0.0–0.1)
Immature Granulocytes: 0 %
Lymphocytes Absolute: 2 10*3/uL (ref 0.7–3.1)
Lymphs: 32 %
MCH: 30.1 pg (ref 26.6–33.0)
MCHC: 33.6 g/dL (ref 31.5–35.7)
MCV: 90 fL (ref 79–97)
Monocytes Absolute: 0.5 10*3/uL (ref 0.1–0.9)
Monocytes: 8 %
Neutrophils Absolute: 3.5 10*3/uL (ref 1.4–7.0)
Neutrophils: 55 %
Platelets: 239 10*3/uL (ref 150–450)
RBC: 4.75 x10E6/uL (ref 4.14–5.80)
RDW: 12 % (ref 11.6–15.4)
WBC: 6.3 10*3/uL (ref 3.4–10.8)

## 2023-11-18 LAB — PROTIME-INR
INR: 0.9 (ref 0.9–1.2)
Prothrombin Time: 10.2 s (ref 9.1–12.0)

## 2023-11-18 LAB — C-REACTIVE PROTEIN: CRP: 1 mg/L (ref 0–10)

## 2023-11-18 NOTE — Progress Notes (Signed)
 [facility] [address] [place], [place] [address]  Internal MEDICINE  Office Visit Note  Patient Name: Vincent Huffman  [id number]  [id number]  Date of Service: 11/18/2023  Chief Complaint  Patient presents with   Acute Visit    Possible exposure to Listeria---- insomnia, upset stomach, memory loss, no fever, headaches     HPI Vincent Huffman presents for an acute sick visit for exposure to 79+ week old celery that was recalled due to listeria contamination.  -- consumed a food that was recalled last week due to listeria contamination -- celery  -- reports increased insomnia, memory loss, muscle aches, upset stomach and headaches.  --denies any fever, chills or other acute signs of infection.  --had labs done yesterday to check for listeria infection.       Current Medication:  Outpatient Encounter Medications as of 11/18/2023  Medication Sig   carisoprodol  (SOMA ) 350 MG tablet Take 1 tablet (350 mg total) by mouth daily as needed for muscle spasms.   cyclobenzaprine  (FLEXERIL ) 10 MG tablet Take 1 tablet (10 mg total) by mouth at bedtime. Take one tab po qhs for back spasm prn only   naproxen  (NAPROSYN ) 500 MG tablet Take 1 tablet (500 mg total) by mouth 2 (two) times daily with a meal.   omeprazole  (PRILOSEC ) 40 MG capsule Take 1 capsule (40 mg total) by mouth 2 (two) times daily before a meal.   [DISCONTINUED] ranitidine  (ZANTAC ) 150 MG capsule Take 1 capsule (150 mg total) by mouth 2 (two) times daily.   No facility-administered encounter medications on file as of 11/18/2023.      Medical History: Past Medical History:  Diagnosis Date   Chronic reflux esophagitis    Complication of anesthesia    "spasms" during EGD   Degenerative joint disease (DJD) of lumbar spine    Esophagitis    GERD (gastroesophageal reflux disease)      Vital Signs: BP 124/76   Pulse (!) 102   Temp 98.5 F (36.9 C)   Resp 16   Ht 6' (1.829 m)   Wt 228 lb (103.4 kg)   SpO2 96%    BMI 30.92 kg/m    Review of Systems  Constitutional:  Positive for appetite change and fatigue. Negative for chills and fever.  Respiratory:  Negative for cough, chest tightness, shortness of breath and wheezing.   Cardiovascular:  Negative for chest pain and palpitations.  Gastrointestinal:  Positive for abdominal distention, abdominal pain and nausea.  Musculoskeletal:  Positive for arthralgias and myalgias.  Neurological:  Positive for headaches.    Physical Exam Vitals reviewed.  Constitutional:      General: He is not in acute distress.    Appearance: Normal appearance. He is obese. He is ill-appearing.  HENT:     Head: Normocephalic and atraumatic.  Eyes:     Pupils: Pupils are equal, round, and reactive to light.  Cardiovascular:     Rate and Rhythm: Normal rate and regular rhythm.     Heart sounds: Normal heart sounds. No murmur heard. Pulmonary:     Effort: Pulmonary effort is normal. No respiratory distress.     Breath sounds: Normal breath sounds. No wheezing.  Abdominal:     General: Bowel sounds are normal.     Palpations: Abdomen is soft.     Tenderness: There is no abdominal tenderness.  Skin:    General: Skin is warm and dry.  Neurological:     Mental Status: He is alert and  oriented to person, place, and time.  Psychiatric:        Mood and Affect: Mood normal.        Behavior: Behavior normal.       Assessment/Plan: 1. Arthritis/arthropathy of multiple joints (Primary) Waiting for lab results   2. Other insomnia Waiting for lab results   3. 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    General Counseling: Serenity verbalizes understanding of the findings of todays visit and agrees with plan of treatment. I have discussed any further diagnostic evaluation that may be needed or ordered today. We also reviewed his medications today. he has been encouraged to call the office with any questions or  concerns that should arise related to todays visit.    Counseling:    No orders of the defined types were placed in this encounter.   No orders of the defined types were placed in this encounter.   Return if symptoms worsen or fail to improve.  [place] Controlled Substance Database was reviewed by me for overdose risk score (ORS)  Time spent:30 Minutes Time spent with patient included reviewing progress notes, labs, imaging studies, and discussing plan for follow up.   This patient was seen by Laurence Pons, FNP-C in collaboration with Dr. Verneta Gone as a part of collaborative care agreement.  Alzora Ha R. Bobbi Burow, MSN, FNP-C Internal Medicine

## 2023-11-23 LAB — CULTURE, BLOOD (SINGLE)

## 2024-01-17 ENCOUNTER — Telehealth: Payer: Self-pay | Admitting: Nurse Practitioner

## 2024-01-17 NOTE — Telephone Encounter (Signed)
 Left vm and sent mychart message to confirm 01/24/24 appointment-[facility]

## 2024-01-24 ENCOUNTER — Encounter: Payer: Self-pay | Admitting: Nurse Practitioner

## 2024-03-15 ENCOUNTER — Telehealth: Payer: Self-pay | Admitting: Nurse Practitioner

## 2024-03-15 NOTE — Telephone Encounter (Signed)
 Left vm and sent mychart message to confirm 03/22/24 appointment-[facility]

## 2024-03-22 ENCOUNTER — Ambulatory Visit: Admitting: Nurse Practitioner

## 2024-03-22 ENCOUNTER — Encounter: Payer: Self-pay | Admitting: Nurse Practitioner

## 2024-03-22 VITALS — BP 130/76 | HR 60 | Temp 96.1°F | Resp 16 | Ht 72.0 in | Wt 230.8 lb

## 2024-03-22 DIAGNOSIS — Z760 Encounter for issue of repeat prescription: Secondary | ICD-10-CM

## 2024-03-22 DIAGNOSIS — M129 Arthropathy, unspecified: Secondary | ICD-10-CM | POA: Diagnosis not present

## 2024-03-22 DIAGNOSIS — G8929 Other chronic pain: Secondary | ICD-10-CM

## 2024-03-22 DIAGNOSIS — M545 Low back pain, unspecified: Secondary | ICD-10-CM | POA: Diagnosis not present

## 2024-03-22 DIAGNOSIS — K200 Eosinophilic esophagitis: Secondary | ICD-10-CM | POA: Diagnosis not present

## 2024-03-22 DIAGNOSIS — Z0001 Encounter for general adult medical examination with abnormal findings: Secondary | ICD-10-CM | POA: Diagnosis not present

## 2024-03-22 MED ORDER — CYCLOBENZAPRINE HCL 10 MG PO TABS: 10.0000 mg | ORAL_TABLET | Freq: Every day | ORAL | 1 refills | Status: DC

## 2024-03-22 MED ORDER — CARISOPRODOL 350 MG PO TABS: 350.0000 mg | ORAL_TABLET | Freq: Every day | ORAL | 1 refills | Status: AC | PRN

## 2024-03-22 NOTE — Progress Notes (Signed)
 [facility] [address] [place], [place] [address]  Internal MEDICINE  Office Visit Note  Patient Name: Vincent Huffman  [id number]  [id number]  Date of Service: 03/22/2024  Chief Complaint  Patient presents with   Gastroesophageal Reflux   Annual Exam    HPI Vincent Huffman presents for an annual well visit and physical exam.  Well-appearing 48 y.o. male with chronic low back pain and eosinophilic esophagitis  Routine CRC screening: due in 2034 Labs: labs done in December last year, hold off on labs for now.  New or worsening pain: none  Other concerns: some trouble sleeping but wants to work on diet and exercise and lessening screen time.  Takes beandryl as needed  Trouble sleeping -- has not tried any OTC medications or supplements yet.   Current Medication: Outpatient Encounter Medications as of 03/22/2024  Medication Sig   carisoprodol  (SOMA ) 350 MG tablet Take 1 tablet (350 mg total) by mouth daily as needed for muscle spasms.   cyclobenzaprine  (FLEXERIL ) 10 MG tablet Take 1 tablet (10 mg total) by mouth at bedtime. Take one tab po qhs for back spasm prn only   omeprazole  (PRILOSEC ) 40 MG capsule Take 1 capsule (40 mg total) by mouth 2 (two) times daily before a meal.   [DISCONTINUED] carisoprodol  (SOMA ) 350 MG tablet Take 1 tablet (350 mg total) by mouth daily as needed for muscle spasms.   [DISCONTINUED] cyclobenzaprine  (FLEXERIL ) 10 MG tablet Take 1 tablet (10 mg total) by mouth at bedtime. Take one tab po qhs for back spasm prn only   [DISCONTINUED] naproxen  (NAPROSYN ) 500 MG tablet Take 1 tablet (500 mg total) by mouth 2 (two) times daily with a meal.   [DISCONTINUED] ranitidine  (ZANTAC ) 150 MG capsule Take 1 capsule (150 mg total) by mouth 2 (two) times daily.   No facility-administered encounter medications on file as of 03/22/2024.    Surgical History: Past Surgical History:  Procedure Laterality Date   ADENOIDECTOMY     COLONOSCOPY WITH PROPOFOL  N/A  10/22/2022   Procedure: COLONOSCOPY WITH PROPOFOL ;  Surgeon: Unk Corinn Skiff, MD;  Location: [facility];  Service: Endoscopy;  Laterality: N/A;   ESOPHAGOGASTRODUODENOSCOPY N/A 02/28/2020   Procedure: ESOPHAGOGASTRODUODENOSCOPY (EGD);  Surgeon: Toledo, Ladell POUR, MD;  Location: [facility];  Service: Gastroenterology;  Laterality: N/A;   ESOPHAGOGASTRODUODENOSCOPY (EGD) WITH PROPOFOL  N/A 10/22/2022   Procedure: ESOPHAGOGASTRODUODENOSCOPY (EGD) WITH PROPOFOL ;  Surgeon: Unk Corinn Skiff, MD;  Location: [facility];  Service: Endoscopy;  Laterality: N/A;   ESOPHAGOGASTRODUODENOSCOPY (EGD) WITH PROPOFOL  N/A 08/10/2023   Procedure: ESOPHAGOGASTRODUODENOSCOPY (EGD) WITH PROPOFOL ;  Surgeon: Jinny Carmine, MD;  Location: [facility];  Service: Endoscopy;  Laterality: N/A;   IMPACTION REMOVAL  08/10/2023   Procedure: IMPACTION REMOVAL;  Surgeon: Jinny Carmine, MD;  Location: [facility];  Service: Endoscopy;;   TONSILLECTOMY      Medical History: Past Medical History:  Diagnosis Date   Chronic reflux esophagitis    Complication of anesthesia    spasms during EGD   Degenerative joint disease (DJD) of lumbar spine    Esophagitis    GERD (gastroesophageal reflux disease)     Family History: Family History  Problem Relation Age of Onset   Heart disease Mother    Heart disease Father    Diabetes Maternal Grandfather     Social History   Socioeconomic History   Marital status: Single    Spouse name: Not on file   Number of children: Not on file   Years of  education: Not on file   Highest education level: Not on file  Occupational History   Occupation: Working Personnel officer   Tobacco Use   Smoking status: Never   Smokeless tobacco: Never  Vaping Use   Vaping status: Never Used  Substance and Sexual Activity   Alcohol use: No   Drug use: No   Sexual activity: Not on file  Other Topics Concern   Not on file  Social History Narrative   Not on file   Social  Drivers of Health   Financial Resource Strain: High Risk (01/11/2018)   Overall Financial Resource Strain (CARDIA)    Difficulty of Paying Living Expenses: Hard  Food Insecurity: Food Insecurity Present (01/11/2018)   Hunger Vital Sign    Worried About Running Out of Food in the Last Year: Sometimes true    Ran Out of Food in the Last Year: Sometimes true  Transportation Needs: No Transportation Needs (01/11/2018)   PRAPARE - Administrator, Civil Service (Medical): No    Lack of Transportation (Non-Medical): No  Physical Activity: Sufficiently Active (01/11/2018)   Exercise Vital Sign    Days of Exercise per Week: 5 days    Minutes of Exercise per Session: 60 min  Stress: No Stress Concern Present (01/11/2018)   [employer] of Occupational Health - Occupational Stress Questionnaire    Feeling of Stress : Only a little  Social Connections: Moderately Isolated (01/11/2018)   Social Connection and Isolation Panel    Frequency of Communication with Friends and Family: Once a week    Frequency of Social Gatherings with Friends and Family: Once a week    Attends Religious Services: More than 4 times per year    Active Member of [employer] or Organizations: No    Attends Banker Meetings: Never    Marital Status: Never married  Intimate Partner Violence: Unknown (01/11/2018)   Humiliation, Afraid, Rape, and Kick questionnaire    Fear of Current or Ex-Partner: Patient declined    Emotionally Abused: Patient declined    Physically Abused: Patient declined    Sexually Abused: Patient declined      Review of Systems  Constitutional:  Negative for activity change, appetite change, chills, fatigue, fever and unexpected weight change.  HENT: Negative.  Negative for congestion, ear pain, rhinorrhea, sore throat and trouble swallowing.   Eyes: Negative.   Respiratory: Negative.  Negative for cough, chest tightness, shortness of breath and wheezing.   Cardiovascular:  Negative.  Negative for chest pain and palpitations.  Gastrointestinal: Negative.  Negative for abdominal pain, blood in stool, constipation, diarrhea, nausea and vomiting.  Endocrine: Negative.   Genitourinary: Negative.  Negative for difficulty urinating, dysuria, frequency, hematuria and urgency.  Musculoskeletal: Negative.  Negative for arthralgias, back pain, joint swelling, myalgias and neck pain.  Skin: Negative.  Negative for rash and wound.  Allergic/Immunologic: Negative.  Negative for immunocompromised state.  Neurological: Negative.  Negative for dizziness, seizures, numbness and headaches.  Hematological: Negative.   Psychiatric/Behavioral: Negative.  Negative for behavioral problems, self-injury and suicidal ideas. The patient is not nervous/anxious.     Vital Signs: BP 130/76   Pulse 60   Temp (!) 96.1 F (35.6 C)   Resp 16   Ht 6' (1.829 m)   Wt 230 lb 12.8 oz (104.7 kg)   SpO2 95%   BMI 31.30 kg/m    Physical Exam Vitals reviewed.  Constitutional:      General: He is not in acute distress.  Appearance: Normal appearance. He is obese. He is not ill-appearing.  HENT:     Head: Normocephalic and atraumatic.     Right Ear: Tympanic membrane, ear canal and external ear normal.     Left Ear: Tympanic membrane, ear canal and external ear normal.     Nose: Nose normal. No congestion or rhinorrhea.     Mouth/Throat:     Mouth: Mucous membranes are moist.     Pharynx: Oropharynx is clear. No oropharyngeal exudate or posterior oropharyngeal erythema.  Eyes:     Extraocular Movements: Extraocular movements intact.     Conjunctiva/sclera: Conjunctivae normal.     Pupils: Pupils are equal, round, and reactive to light.  Cardiovascular:     Rate and Rhythm: Normal rate and regular rhythm.     Pulses: Normal pulses.     Heart sounds: Normal heart sounds. No murmur heard. Pulmonary:     Effort: Pulmonary effort is normal. No respiratory distress.     Breath sounds:  Normal breath sounds.  Abdominal:     General: Bowel sounds are normal. There is no distension.     Palpations: Abdomen is soft. There is no mass.     Tenderness: There is no abdominal tenderness. There is no guarding or rebound.     Hernia: No hernia is present.  Musculoskeletal:        General: Normal range of motion.     Cervical back: Normal range of motion and neck supple.     Right lower leg: No edema.     Left lower leg: No edema.  Lymphadenopathy:     Cervical: No cervical adenopathy.  Skin:    General: Skin is warm and dry.     Capillary Refill: Capillary refill takes less than 2 seconds.  Neurological:     Mental Status: He is alert and oriented to person, place, and time.     Cranial Nerves: No cranial nerve deficit.     Coordination: Coordination normal.     Gait: Gait normal.  Psychiatric:        Mood and Affect: Mood normal.        Behavior: Behavior normal.        Thought Content: Thought content normal.        Judgment: Judgment normal.        Assessment/Plan: 1. Encounter for routine adult health examination with abnormal findings (Primary) Age-appropriate preventive screenings and vaccinations discussed, annual physical exam completed. Routine labs for health maintenance done previously and results previously discussed. PHM updated.    2. Eosinophilic esophagitis Follow up with GI as needed   3. Chronic midline low back pain without sciatica Takes soma  only for severe musculoskeletal pains. Continue as prescribed.  - carisoprodol  (SOMA ) 350 MG tablet; Take 1 tablet (350 mg total) by mouth daily as needed for muscle spasms.  Dispense: 30 tablet; Refill: 1  4. Arthritis/arthropathy of multiple joints Takes cyclobenzaprine  as needed for mild to moderate musculoskeletal pains. Continue as prescribed.  - cyclobenzaprine  (FLEXERIL ) 10 MG tablet; Take 1 tablet (10 mg total) by mouth at bedtime. Take one tab po qhs for back spasm prn only  Dispense: 30 tablet;  Refill: 1     General Counseling: Vincent Huffman verbalizes understanding of the findings of todays visit and agrees with plan of treatment. I have discussed any further diagnostic evaluation that may be needed or ordered today. We also reviewed his medications today. he has been encouraged to call the office with any questions  or concerns that should arise related to todays visit.    No orders of the defined types were placed in this encounter.   Meds ordered this encounter  Medications   cyclobenzaprine  (FLEXERIL ) 10 MG tablet    Sig: Take 1 tablet (10 mg total) by mouth at bedtime. Take one tab po qhs for back spasm prn only    Dispense:  30 tablet    Refill:  1    For future refills, patient will call   carisoprodol  (SOMA ) 350 MG tablet    Sig: Take 1 tablet (350 mg total) by mouth daily as needed for muscle spasms.    Dispense:  30 tablet    Refill:  1    Fill new script    Return in about 1 year (around 03/22/2025) for CPE, Lodie Waheed PCP and otherwise as needed .   Total time spent:30 Minutes Time spent includes review of chart, medications, test results, and follow up plan with the patient.   [place] Controlled Substance Database was reviewed by me.  This patient was seen by Mardy Maxin, FNP-C in collaboration with Dr. Sigrid Bathe as a part of collaborative care agreement.  Marney Treloar R. Maxin, MSN, FNP-C Internal medicine

## 2024-05-06 ENCOUNTER — Encounter: Payer: Self-pay | Admitting: Nurse Practitioner

## 2024-05-06 DIAGNOSIS — K200 Eosinophilic esophagitis: Secondary | ICD-10-CM | POA: Insufficient documentation

## 2024-05-21 ENCOUNTER — Telehealth: Admitting: Family

## 2024-05-21 DIAGNOSIS — M546 Pain in thoracic spine: Secondary | ICD-10-CM

## 2024-05-21 MED ORDER — NAPROXEN 500 MG PO TABS: 500.0000 mg | ORAL_TABLET | Freq: Two times a day (BID) | ORAL | 0 refills | Status: AC

## 2024-05-21 MED ORDER — TIZANIDINE HCL 2 MG PO TABS: 2.0000 mg | ORAL_TABLET | Freq: Four times a day (QID) | ORAL | 0 refills | Status: DC | PRN

## 2024-05-21 NOTE — Progress Notes (Signed)

## 2024-08-12 ENCOUNTER — Telehealth: Admitting: Family Medicine

## 2024-08-12 DIAGNOSIS — M546 Pain in thoracic spine: Secondary | ICD-10-CM

## 2024-08-12 MED ORDER — PREDNISONE 20 MG PO TABS: 20.0000 mg | ORAL_TABLET | Freq: Two times a day (BID) | ORAL | 0 refills | Status: AC

## 2024-08-12 MED ORDER — TIZANIDINE HCL 4 MG PO TABS: 4.0000 mg | ORAL_TABLET | Freq: Four times a day (QID) | ORAL | 0 refills | Status: DC | PRN

## 2024-08-12 NOTE — Progress Notes (Signed)
 We are sorry that you are not feeling well.  Here is how we plan to help!  Based on what you have shared with me it, appears you may be experiencing acute back pain.   Acute back pain is defined as musculoskeletal pain that can resolve in 1-3 weeks with conservative treatment.  I have prescribed prednisone , as well as a muscle relaxant, zanaflex . Some patients experience stomach irritation or in increased heartburn with anti-inflammatory drugs.  Please keep in mind that muscle relaxer's can cause fatigue and should not be taken while at work or driving.  Back pain is very common.  The pain often gets better over time.  The cause of back pain is usually not dangerous.  Most people can learn to manage their back pain on their own.  Home Care Stay active.  Start with short walks on flat ground if you can.  Try to walk farther each day. Do not sit, drive or stand in one place for more than 30 minutes.  Do not stay in bed. Do not fully avoid exercise or work.  Activity can help your back heal faster. Be careful when you bend or lift an object.  Bend at your knees, keep the object close to you, and do not twist. Sleep on a firm mattress.  Lie on your side, and bend your knees.  If you lie on your back, put a pillow under your knees. Only take medicines as told by your doctor. Put ice on the injured area. Put ice in a plastic bag Place a towel between your skin and the bag Leave the ice on for 15-20 minutes, 3-4 times a day for the first 2-3 days.  After that, you can switch between ice and heat packs. Ask your doctor about back exercises or massage.   Get Help Right Way If: Your pain does not go away with rest and treatments given today. Your pain does not go away within 1 week. You have new problems. You do not feel well. The pain spreads into your legs. You cannot control when you poop (bowel movement) or pee (urinate). You feel sick to your stomach (nauseous) or throw up (vomit). You have  belly (abdominal) pain. You feel like you may pass out (faint). If you develop a fever.  Make Sure you: Understand these instructions. Continue to monitor your condition for any changes. Will get help right away if you are not doing well or get worse.  Your e-visit answers were reviewed by a board certified advanced clinical practitioner to complete your personal care plan.  Depending on the condition, your plan could have included both over the counter or prescription medications.  If there is a problem, please reply once you have received a response from your provider.  Your safety is important to us .  If you have drug allergies, check your prescription carefully.    You can use MyChart to ask questions about todays visit, request a non-urgent call back, or ask for a work or school excuse for 24 hours related to this e-Visit. If it has been greater than 24 hours you will need to follow up with your provider or enter a new e-Visit to address those concerns.  You will get an e-mail in the next two days asking about your experience.  I hope that your e-visit has been valuable and will speed your recovery. Thank you for using e-visits.   I have spent 5 minutes in review of e-visit questionnaire, review and updating  patient chart, medical decision making and response to patient.   Zaliah Wissner, FNP

## 2024-08-16 ENCOUNTER — Ambulatory Visit: Admission: EM | Admit: 2024-08-16 | Discharge: 2024-08-16 | Disposition: A | Discharge status: Home / Self Care

## 2024-08-16 DIAGNOSIS — R197 Diarrhea, unspecified: Secondary | ICD-10-CM | POA: Diagnosis not present

## 2024-08-16 DIAGNOSIS — M549 Dorsalgia, unspecified: Secondary | ICD-10-CM

## 2024-08-16 DIAGNOSIS — R110 Nausea: Secondary | ICD-10-CM | POA: Diagnosis not present

## 2024-08-16 MED ORDER — ONDANSETRON 4 MG PO TBDP: 4.0000 mg | ORAL_TABLET | Freq: Three times a day (TID) | ORAL | 0 refills | Status: DC | PRN

## 2024-08-16 NOTE — Discharge Instructions (Addendum)
 Take the Zofran  as needed for nausea.  Keep yourself hydrated with clear liquids.  Follow up with your primary care provider.  Go to the emergency department if you have worsening symptoms.

## 2024-08-16 NOTE — ED Triage Notes (Addendum)
 Patient to Urgent Care with complaints of body aches and back pain/ diarrhea/ nausea/ vomiting/ drainage. Unsure of any fevers- chills.   Symptoms for over a week. Works as a engineer, site. Completed an e-visit and prescribed norflex with little relief.

## 2024-08-16 NOTE — ED Provider Notes (Signed)
 " [facility]    CSN: [id number] Arrival date & time: 08/16/24  9048      History   Chief Complaint Chief Complaint  Patient presents with   Fever   Emesis   Nausea   Generalized Body Aches    HPI Vincent Huffman is a 49 y.o. male.  Patient presents with muscular pain across his mid back x 1 week.  He states it feels like a muscle spasm.  He also reports nausea and diarrhea in the last few days.  No vomiting, abdominal pain, fever.  He is currently on prednisone  and has been taking Zanaflex  also.  His medical history includes degenerative disc disease.  He reports chronic intermittent back pain.  Patient had an e-visit with [facility] on 08/11/2024; diagnosed with upper back pain; treated with Norflex.  He had a telehealth visit with [facility] on 08/12/2024; treated with Zanaflex  and prednisone .  The history is provided by the patient and medical records.    Past Medical History:  Diagnosis Date   Chronic reflux esophagitis    Complication of anesthesia    spasms during EGD   Degenerative joint disease (DJD) of lumbar spine    Esophagitis    GERD (gastroesophageal reflux disease)     Patient Active Problem List   Diagnosis Date Noted   Eosinophilic esophagitis 05/06/2024   Esophageal obstruction due to food impaction 08/10/2023   Stricture of esophagus 08/10/2023   Hypogonadism in male 07/20/2023   Impaired fasting glucose 07/20/2023   Arthritis/arthropathy of multiple joints 07/20/2023   Dysphagia 10/22/2022   Hiatal hernia 10/22/2022   Irregular cardiac rhythm 10/25/2021   Healthcare maintenance 12/16/2017   Herniation of nucleus pulposus, 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   Somatization disorder 07/13/2013   Low back pain 11/14/2010    Past Surgical History:  Procedure Laterality Date   ADENOIDECTOMY     COLONOSCOPY WITH PROPOFOL  N/A 10/22/2022   Procedure: COLONOSCOPY WITH PROPOFOL ;  Surgeon: Unk Corinn Skiff, MD;  Location: [facility];  Service:  Endoscopy;  Laterality: N/A;   ESOPHAGOGASTRODUODENOSCOPY N/A 02/28/2020   Procedure: ESOPHAGOGASTRODUODENOSCOPY (EGD);  Surgeon: Toledo, Ladell POUR, MD;  Location: [facility];  Service: Gastroenterology;  Laterality: N/A;   ESOPHAGOGASTRODUODENOSCOPY (EGD) WITH PROPOFOL  N/A 10/22/2022   Procedure: ESOPHAGOGASTRODUODENOSCOPY (EGD) WITH PROPOFOL ;  Surgeon: Unk Corinn Skiff, MD;  Location: [facility];  Service: Endoscopy;  Laterality: N/A;   ESOPHAGOGASTRODUODENOSCOPY (EGD) WITH PROPOFOL  N/A 08/10/2023   Procedure: ESOPHAGOGASTRODUODENOSCOPY (EGD) WITH PROPOFOL ;  Surgeon: Jinny Carmine, MD;  Location: [facility];  Service: Endoscopy;  Laterality: N/A;   IMPACTION REMOVAL  08/10/2023   Procedure: IMPACTION REMOVAL;  Surgeon: Jinny Carmine, MD;  Location: [facility];  Service: Endoscopy;;   TONSILLECTOMY         Home Medications    Prior to Admission medications  Medication Sig Start Date End Date Taking? Authorizing Provider  ondansetron  (ZOFRAN -ODT) 4 MG disintegrating tablet Take 1 tablet (4 mg total) by mouth every 8 (eight) hours as needed for nausea or vomiting. 08/16/24  Yes Corlis Burnard DEL, NP  orphenadrine (NORFLEX) 100 MG tablet Take 100 mg by mouth. 08/11/24 08/21/24 Yes [provider]  predniSONE  (DELTASONE ) 20 MG tablet Take 1 tablet (20 mg total) by mouth 2 (two) times daily with a meal for 5 days. 08/12/24 08/17/24 Yes Blair, Diane W, FNP  carisoprodol  (SOMA ) 350 MG tablet Take 1 tablet (350 mg total) by mouth daily as needed for muscle spasms. Patient not taking: Reported on 08/16/2024 03/22/24  Liana Fish, NP  naproxen  (NAPROSYN ) 500 MG tablet Take 1 tablet (500 mg total) by mouth 2 (two) times daily with a meal. Patient not taking: Reported on 08/16/2024 05/21/24   Lavell Bari LABOR, FNP  omeprazole  (PRILOSEC ) 40 MG capsule Take 1 capsule (40 mg total) by mouth 2 (two) times daily before a meal. Patient not taking: Reported on 08/16/2024 10/26/22   Unk Corinn Skiff, MD  tiZANidine  (ZANAFLEX ) 2 MG tablet Take 1 tablet (2 mg total) by mouth every 6 (six) hours as needed for muscle spasms. Patient not taking: Reported on 08/16/2024 05/21/24   Lavell Bari LABOR, FNP  tiZANidine  (ZANAFLEX ) 4 MG tablet Take 1 tablet (4 mg total) by mouth every 6 (six) hours as needed for muscle spasms. Patient not taking: Reported on 08/16/2024 08/12/24   Blair, Diane W, FNP  ranitidine  (ZANTAC ) 150 MG capsule Take 1 capsule (150 mg total) by mouth 2 (two) times daily. 07/08/15 07/08/15  Viviann Pastor, MD    Family History Family History  Problem Relation Age of Onset   Heart disease Mother    Heart disease Father    Diabetes Maternal Grandfather     Social History Social History[1]   Allergies   Iodinated contrast media   Review of Systems Review of Systems  Constitutional:  Negative for chills and fever.  Respiratory:  Negative for cough and shortness of breath.   Cardiovascular:  Negative for chest pain and palpitations.  Gastrointestinal:  Positive for diarrhea and nausea. Negative for abdominal pain and vomiting.  Musculoskeletal:  Positive for back pain. Negative for gait problem and joint swelling.  Neurological:  Negative for weakness and numbness.     Physical Exam Triage Vital Signs ED Triage Vitals  Encounter Vitals Group     BP 08/16/24 1033 135/85     Girls Systolic BP Percentile --      Girls Diastolic BP Percentile --      Boys Systolic BP Percentile --      Boys Diastolic BP Percentile --      Pulse Rate 08/16/24 1033 66     Resp 08/16/24 1033 18     Temp 08/16/24 1033 97.6 F (36.4 C)     Temp src --      SpO2 08/16/24 1033 97 %     Weight --      Height --      Head Circumference --      Peak Flow --      Pain Score 08/16/24 1040 4     Pain Loc --      Pain Education --      Exclude from Growth Chart --    No data found.  Updated Vital Signs BP 135/85   Pulse 66   Temp 97.6 F (36.4 C)   Resp 18   SpO2  97%   Visual Acuity Right Eye Distance:   Left Eye Distance:   Bilateral Distance:    Right Eye Near:   Left Eye Near:    Bilateral Near:     Physical Exam Constitutional:      General: He is not in acute distress. HENT:     Mouth/Throat:     Mouth: Mucous membranes are moist.  Cardiovascular:     Rate and Rhythm: Normal rate and regular rhythm.     Heart sounds: Normal heart sounds.  Pulmonary:     Effort: Pulmonary effort is normal. No respiratory distress.     Breath sounds:  Normal breath sounds.  Abdominal:     General: Bowel sounds are normal.     Palpations: Abdomen is soft.     Tenderness: There is no abdominal tenderness. There is no right CVA tenderness, left CVA tenderness, guarding or rebound.  Musculoskeletal:        General: Tenderness present. No swelling or deformity. Normal range of motion.     Comments: Mid back muscular tenderness bilaterally.  Skin:    General: Skin is warm and dry.     Findings: No bruising, erythema, lesion or rash.  Neurological:     General: No focal deficit present.     Mental Status: He is alert.     Sensory: No sensory deficit.     Motor: No weakness.     Gait: Gait normal.      UC Treatments / Results  Labs (all labs ordered are listed, but only abnormal results are displayed) Labs Reviewed - No data to display  EKG   Radiology No results found.  Procedures Procedures (including critical care time)  Medications Ordered in UC Medications - No data to display  Initial Impression / Assessment and Plan / UC Course  I have reviewed the triage vital signs and the nursing notes.  Pertinent labs & imaging results that were available during my care of the patient were reviewed by me and considered in my medical decision making (see chart for details).   Mid back pain, nausea without vomiting, diarrhea.  Afebrile and vital signs are stable.  Lungs are clear and O2 sat is 97% on room air.  Work note provided per patient  request.  Zofran  prescribed for nausea.  Instructed patient to keep himself hydrated with clear liquids and to advance his diet as tolerated.  Instructed him to follow-up with his PCP.  ED precautions given.  Education provided on back pain, nausea, diarrhea.  He agrees to plan of care.   Final Clinical Impressions(s) / UC Diagnoses   Final diagnoses:  Mid back pain  Nausea without vomiting  Diarrhea, unspecified type     Discharge Instructions      Take the Zofran  as needed for nausea.  Keep yourself hydrated with clear liquids.  Follow up with your primary care provider.  Go to the emergency department if you have worsening symptoms.        ED Prescriptions     Medication Sig Dispense Auth. Provider   ondansetron  (ZOFRAN -ODT) 4 MG disintegrating tablet Take 1 tablet (4 mg total) by mouth every 8 (eight) hours as needed for nausea or vomiting. 20 tablet Corlis Burnard DEL, NP      I have reviewed the PDMP during this encounter.    [1]  Social History Tobacco Use   Smoking status: Never   Smokeless tobacco: Never  Vaping Use   Vaping status: Never Used  Substance Use Topics   Alcohol use: No   Drug use: No     Corlis Burnard DEL, NP 08/16/24 1152  "

## 2024-09-07 ENCOUNTER — Encounter: Payer: Self-pay | Admitting: Nurse Practitioner

## 2024-09-07 ENCOUNTER — Ambulatory Visit: Admitting: Nurse Practitioner

## 2024-09-07 VITALS — BP 132/80 | HR 70 | Temp 97.0°F | Resp 16 | Ht 72.0 in | Wt 226.2 lb

## 2024-09-07 DIAGNOSIS — M62830 Muscle spasm of back: Secondary | ICD-10-CM | POA: Insufficient documentation

## 2024-09-07 DIAGNOSIS — M545 Low back pain, unspecified: Secondary | ICD-10-CM

## 2024-09-07 DIAGNOSIS — G8929 Other chronic pain: Secondary | ICD-10-CM | POA: Diagnosis not present

## 2024-09-07 DIAGNOSIS — M129 Arthropathy, unspecified: Secondary | ICD-10-CM | POA: Diagnosis not present

## 2024-09-07 MED ORDER — PREDNISONE 10 MG (21) PO TBPK: ORAL_TABLET | ORAL | 0 refills | Status: AC

## 2024-09-07 MED ORDER — METHOCARBAMOL 750 MG PO TABS: 750.0000 mg | ORAL_TABLET | Freq: Four times a day (QID) | ORAL | 2 refills | Status: AC | PRN

## 2024-09-07 NOTE — Progress Notes (Cosign Needed)
 [facility] [address] [place], [place] [address]  Internal MEDICINE  Office Visit Note  Patient Name: Vincent Huffman  [id number]  [id number]  Date of Service: 09/07/2024  Chief Complaint  Patient presents with   Acute Visit    Back spasms     HPI Davinder presents for an acute sick visit for back spasms and pain.  --onset was about 2-3 months ago, pain in foot, feels like there is a sock crease or something pushing up against the ball of the left foot. Had some flu like symptoms and then the back starting hurting more and the back spasms started.  He had an urgent care visit in January for back pain.  He gets back pain flare ups every now and then He is going to physical therapy for dry needling and is seeing an acupuncturist.    Current Medication:  Outpatient Encounter Medications as of 09/07/2024  Medication Sig   carisoprodol  (SOMA ) 350 MG tablet Take 1 tablet (350 mg total) by mouth daily as needed for muscle spasms.   cyclobenzaprine  (FLEXERIL ) 10 MG tablet Take 10 mg by mouth at bedtime as needed.   methocarbamol  (ROBAXIN ) 750 MG tablet Take 1-2 tablets (750-1,500 mg total) by mouth every 6 (six) hours as needed for muscle spasms.   naproxen  (NAPROSYN ) 500 MG tablet Take 1 tablet (500 mg total) by mouth 2 (two) times daily with a meal.   omeprazole  (PRILOSEC ) 40 MG capsule Take 1 capsule (40 mg total) by mouth 2 (two) times daily before a meal.   predniSONE  (STERAPRED UNI-PAK 21 TAB) 10 MG (21) TBPK tablet Use as directed for 6 days   [DISCONTINUED] ondansetron  (ZOFRAN -ODT) 4 MG disintegrating tablet Take 1 tablet (4 mg total) by mouth every 8 (eight) hours as needed for nausea or vomiting.   [DISCONTINUED] ranitidine  (ZANTAC ) 150 MG capsule Take 1 capsule (150 mg total) by mouth 2 (two) times daily.   [DISCONTINUED] tiZANidine  (ZANAFLEX ) 2 MG tablet Take 1 tablet (2 mg total) by mouth every 6 (six) hours as needed for muscle spasms. (Patient not taking:  Reported on 08/16/2024)   [DISCONTINUED] tiZANidine  (ZANAFLEX ) 4 MG tablet Take 1 tablet (4 mg total) by mouth every 6 (six) hours as needed for muscle spasms. (Patient not taking: Reported on 08/16/2024)   No facility-administered encounter medications on file as of 09/07/2024.      Medical History: Past Medical History:  Diagnosis Date   Chronic reflux esophagitis    Complication of anesthesia    spasms during EGD   Degenerative joint disease (DJD) of lumbar spine    Esophagitis    GERD (gastroesophageal reflux disease)      Vital Signs: BP 132/80   Pulse 70   Temp (!) 97 F (36.1 C)   Resp 16   Ht 6' (1.829 m)   Wt 226 lb 3.2 oz (102.6 kg)   SpO2 96%   BMI 30.68 kg/m    Review of Systems  Constitutional:  Positive for fatigue. Negative for appetite change, chills and fever.  Respiratory:  Negative for cough, chest tightness, shortness of breath and wheezing.   Cardiovascular:  Negative for chest pain and palpitations.  Gastrointestinal:  Negative for abdominal distention, abdominal pain and nausea.  Musculoskeletal:  Positive for arthralgias, back pain and myalgias.  Neurological:  Positive for headaches.    Physical Exam Vitals reviewed.  Constitutional:      General: He is not in acute distress.    Appearance: Normal appearance.  He is obese. He is not ill-appearing.  HENT:     Head: Normocephalic and atraumatic.  Eyes:     Pupils: Pupils are equal, round, and reactive to light.  Cardiovascular:     Rate and Rhythm: Normal rate and regular rhythm.  Pulmonary:     Effort: Pulmonary effort is normal. No respiratory distress.  Skin:    General: Skin is warm and dry.     Capillary Refill: Capillary refill takes less than 2 seconds.  Neurological:     Mental Status: He is alert and oriented to person, place, and time.  Psychiatric:        Mood and Affect: Mood normal.        Behavior: Behavior normal.       Assessment/Plan: 1. Chronic midline low back  pain without sciatica (Primary) Takes methocarbamol  as needed as prescribed., prednisone  taper prescribed, take until gone  - cyclobenzaprine  (FLEXERIL ) 10 MG tablet; Take 10 mg by mouth at bedtime as needed. -- not prescribed, updated.  - methocarbamol  (ROBAXIN ) 750 MG tablet; Take 1-2 tablets (750-1,500 mg total) by mouth every 6 (six) hours as needed for muscle spasms.  Dispense: 120 tablet; Refill: 2 - predniSONE  (STERAPRED UNI-PAK 21 TAB) 10 MG (21) TBPK tablet; Use as directed for 6 days  Dispense: 21 tablet; Refill: 0  2. Back muscle spasm Takes methocarbamol  as needed as prescribed., prednisone  taper prescribed, take until gone  - cyclobenzaprine  (FLEXERIL ) 10 MG tablet; Take 10 mg by mouth at bedtime as needed. -- not prescribed today, only updated.  - methocarbamol  (ROBAXIN ) 750 MG tablet; Take 1-2 tablets (750-1,500 mg total) by mouth every 6 (six) hours as needed for muscle spasms.  Dispense: 120 tablet; Refill: 2 - predniSONE  (STERAPRED UNI-PAK 21 TAB) 10 MG (21) TBPK tablet; Use as directed for 6 days  Dispense: 21 tablet; Refill: 0  3. Arthritis/arthropathy of multiple joints Takes flexeril  as needed usually but makes him sleepy. Will try methocarbamol  and take a break from flexeril  for now.  - cyclobenzaprine  (FLEXERIL ) 10 MG tablet; Take 10 mg by mouth at bedtime as needed.   General Counseling: Matthieu verbalizes understanding of the findings of todays visit and agrees with plan of treatment. I have discussed any further diagnostic evaluation that may be needed or ordered today. We also reviewed his medications today. he has been encouraged to call the office with any questions or concerns that should arise related to todays visit.    Counseling:    No orders of the defined types were placed in this encounter.   Meds ordered this encounter  Medications   methocarbamol  (ROBAXIN ) 750 MG tablet    Sig: Take 1-2 tablets (750-1,500 mg total) by mouth every 6 (six) hours as  needed for muscle spasms.    Dispense:  120 tablet    Refill:  2    Fill new script today   predniSONE  (STERAPRED UNI-PAK 21 TAB) 10 MG (21) TBPK tablet    Sig: Use as directed for 6 days    Dispense:  21 tablet    Refill:  0    Return if symptoms worsen or fail to improve, for keep regular scheduled follow up .  [place] Controlled Substance Database was reviewed by me for overdose risk score (ORS)  Time spent:30 Minutes Time spent with patient included reviewing progress notes, labs, imaging studies, and discussing plan for follow up.   This patient was seen by Mardy Maxin, FNP-C in collaboration with Dr. Sigrid Bathe  as a part of collaborative care agreement.  Jaia Alonge R. Liana, MSN, FNP-C Internal Medicine

## 2025-03-26 ENCOUNTER — Encounter: Admitting: Nurse Practitioner
